# Patient Record
Sex: Male | Born: 1970 | Hispanic: Yes | Marital: Single | Smoking: Never smoker
Health system: Southern US, Community
[De-identification: ages and names within clinical notes are randomized; demographics above are authoritative.]

## PROBLEM LIST (undated history)

## (undated) DIAGNOSIS — R7303 Prediabetes: Secondary | ICD-10-CM

---

## 2015-09-30 ENCOUNTER — Encounter (HOSPITAL_COMMUNITY): Payer: Self-pay | Admitting: *Deleted

## 2015-09-30 ENCOUNTER — Emergency Department (HOSPITAL_COMMUNITY)
Admission: EM | Admit: 2015-09-30 | Discharge: 2015-09-30 | Disposition: A | Discharge status: Home / Self Care | Payer: Self-pay | Attending: Emergency Medicine | Admitting: Emergency Medicine

## 2015-09-30 DIAGNOSIS — J029 Acute pharyngitis, unspecified: Secondary | ICD-10-CM | POA: Insufficient documentation

## 2015-09-30 MED ORDER — HYDROCODONE-ACETAMINOPHEN 5-325 MG PO TABS: 2.0000 | ORAL_TABLET | Freq: Once | ORAL | Status: DC

## 2015-09-30 NOTE — ED Notes (Signed)
Pt in c/o sore throat for the last month, states he has been seen in the past for same but symptoms are not improving, pt reports bleeding in the back of his throat at times, denies fever

## 2015-09-30 NOTE — ED Provider Notes (Signed)
CSN: [id number]     Arrival date & time 09/30/15  1919 History  By signing my name below, I, Placido Sou, attest that this documentation has been prepared under the direction and in the presence of Melton Krebs PA-C. Electronically Signed: Placido Sou, ED Scribe. 09/30/2015. 7:42 PM.    Chief Complaint  Patient presents with  . Sore Throat   The history is provided by the patient. No language interpreter was used.   HPI Comments: Vincent Sawyer is a 45 y.o. male who presents to the Emergency Department complaining of worsening, moderate, bilateral sore throat onset 1 month ago. Pt was seen 1 month ago for similar symptoms and prescribed diclofenac which he says has provided no relief further noting associated, intermittent, hemoptysis that began after singing and voice change. He denies a hx of smoking or a SHx to his neck. He denies any recent travel. Pt reports a PMHx including pre-diabetes. Pt denies fevers, chills, cough or any other associated symptoms at this time.   No past medical history on file. No past surgical history on file. No family history on file. Social History  Substance Use Topics  . Smoking status: Not on file  . Smokeless tobacco: Not on file  . Alcohol Use: Not on file    Review of Systems A complete 10 system review of systems was obtained and all systems are negative except as noted in the HPI and PMH.   Allergies  Review of patient's allergies indicates not on file.  Home Medications   Prior to Admission medications   Not on File   BP 115/72 mmHg  Pulse 61  Temp(Src) 97.8 F (36.6 C) (Oral)  Resp 16  SpO2 100%  Physical Exam  Constitutional: He is oriented to person, place, and time. He appears well-developed and well-nourished.  HENT:  Head: Normocephalic and atraumatic.  Mouth/Throat: Uvula is midline.  Tonsils 1+ bilaterally without erythema. Airway intact. No uvula deviation. No masses visualized  Eyes: EOM are  normal.  Neck: Normal range of motion.  Cardiovascular: Normal rate.   Pulmonary/Chest: Effort normal. No respiratory distress.  Abdominal: Soft.  Musculoskeletal: Normal range of motion.  Neurological: He is alert and oriented to person, place, and time.  Skin: Skin is warm and dry.  Psychiatric: He has a normal mood and affect.  Nursing note and vitals reviewed.  ED Course  Procedures  DIAGNOSTIC STUDIES: Oxygen Saturation is 100% on RA, normal by my interpretation.    COORDINATION OF CARE: 7:39 PM Discussed next steps with pt. He verbalized understanding and is agreeable with the plan.   MDM   Final diagnoses:  Pharyngitis   Patient non-toxic appearing and VSS. Normal ENT exam. I will have patient follow-up with ENT for his ongoing hoarseness and "bleeding throat." No concern for Ludwigs or PTA.  Patient may be safely discharged home. Discussed reasons for return. Patient to follow-up with ENT. Patient in understanding and agreement with the plan.  I personally performed the services described in this documentation, which was scribed in my presence. The recorded information has been reviewed and is accurate.    Melton Krebs, PA-C 10/01/15 0041  Benjiman Core, MD 10/01/15 1550

## 2015-09-30 NOTE — ED Notes (Signed)
See PA assessment 

## 2015-09-30 NOTE — Discharge Instructions (Signed)
Faringitis  (Pharyngitis)  La faringitis ocurre cuando la faringe presenta enrojecimiento, dolor e hinchazón (inflamación).   CAUSAS   Normalmente, la faringitis se debe a una infección. Generalmente, estas infecciones ocurren debido a virus (viral) y se presentan cuando las personas se resfrían. Sin embargo, a veces la faringitis es provocada por bacterias (bacteriana). Las alergias también pueden ser una causa de la faringitis. La faringitis viral se puede contagiar de una persona a otra al toser, estornudar y compartir objetos o utensilios personales (tazas, tenedores, cucharas, cepillos de diente). La faringitis bacteriana se puede contagiar de una persona a otra a través de un contacto más íntimo, como besar.   SIGNOS Y SÍNTOMAS   Los síntomas de la faringitis incluyen los siguientes:   · Dolor de garganta.  · Cansancio (fatiga).  · Fiebre no muy elevada.  · Dolor de cabeza.  · Dolores musculares y en las articulaciones.  · Erupciones cutáneas  · Ganglios linfáticos hinchados.  · Una película parecida a las placas en la garganta o las amígdalas (frecuente con la faringitis bacteriana).  DIAGNÓSTICO   El médico le hará preguntas sobre la enfermedad y sus síntomas. Normalmente, todo lo que se necesita para diagnosticar una faringitis son sus antecedentes médicos y un examen físico. A veces se realiza una prueba rápida para estreptococos. También es posible que se realicen otros análisis de laboratorio, según la posible causa.   TRATAMIENTO   La faringitis viral normalmente mejorará en un plazo de 3 a 4 días sin medicamentos. La faringitis bacteriana se trata con medicamentos que matan los gérmenes (antibióticos).   INSTRUCCIONES PARA EL CUIDADO EN EL HOGAR   · Beba gran cantidad de líquido para mantener la orina de tono claro o color amarillo pálido.  · Tome solo medicamentos de venta libre o recetados, según las indicaciones del médico.    Si le receta antibióticos, asegúrese de terminarlos, incluso si comienza  a sentirse mejor.    No tome aspirina.  · Descanse lo suficiente.  · Hágase gárgaras con 8 onzas (227 ml) de agua con sal (½ cucharadita de sal por litro de agua) cada 1 o 2 horas para calmar la garganta.  · Puede usar pastillas (si no corre riesgo de ahogarse) o aerosoles para calmar la garganta.  SOLICITE ATENCIÓN MÉDICA SI:   · Tiene bultos grandes y dolorosos en el cuello.  · Tiene una erupción cutánea.  · Cuando tose elimina una expectoración verde, amarillo amarronado o con sangre.  SOLICITE ATENCIÓN MÉDICA DE INMEDIATO SI:   · El cuello se pone rígido.  · Comienza a babear o no puede tragar líquidos.  · Vomita o no puede retener los medicamentos ni los líquidos.  · Siente un dolor intenso que no se alivia con los medicamentos recomendados.  · Tiene dificultades para respirar (y no debido a la nariz tapada).  ASEGÚRESE DE QUE:   · Comprende estas instrucciones.  · Controlará su afección.  · Recibirá ayuda de inmediato si no mejora o si empeora.     Esta información no tiene como fin reemplazar el consejo del médico. Asegúrese de hacerle al médico cualquier pregunta que tenga.     Document Released: 05/16/2005 Document Revised: 05/27/2013  Elsevier Interactive Patient Education ©2016 Elsevier Inc.

## 2015-12-10 ENCOUNTER — Emergency Department (HOSPITAL_COMMUNITY)
Admission: EM | Admit: 2015-12-10 | Discharge: 2015-12-11 | Disposition: A | Discharge status: Home / Self Care | Payer: Self-pay | Attending: Emergency Medicine | Admitting: Emergency Medicine

## 2015-12-10 ENCOUNTER — Encounter (HOSPITAL_COMMUNITY): Payer: Self-pay | Admitting: Emergency Medicine

## 2015-12-10 DIAGNOSIS — L509 Urticaria, unspecified: Secondary | ICD-10-CM | POA: Insufficient documentation

## 2015-12-10 HISTORY — DX: Prediabetes: R73.03

## 2015-12-10 NOTE — ED Notes (Signed)
Pt. presents with generalized itchy skin rashes/hives onset 3 days ago unrelieved by OTC antihistamine , respirations unlabored , no oral swelling or stridor . Denies fever or chills.

## 2015-12-11 MED ORDER — FAMOTIDINE 20 MG PO TABS: 20.0000 mg | ORAL_TABLET | Freq: Two times a day (BID) | ORAL | Status: DC

## 2015-12-11 MED ORDER — PREDNISONE 20 MG PO TABS: 40.0000 mg | ORAL_TABLET | Freq: Every day | ORAL | Status: DC

## 2015-12-11 MED ORDER — FAMOTIDINE IN NACL 20-0.9 MG/50ML-% IV SOLN
20.0000 mg | Freq: Once | INTRAVENOUS | Status: AC
  Administered 2015-12-11: 20 mg via INTRAVENOUS
  Filled 2015-12-11: qty 50

## 2015-12-11 MED ORDER — DEXAMETHASONE SODIUM PHOSPHATE 10 MG/ML IJ SOLN
10.0000 mg | Freq: Once | INTRAMUSCULAR | Status: AC
  Administered 2015-12-11: 10 mg via INTRAVENOUS
  Filled 2015-12-11: qty 1

## 2015-12-11 NOTE — ED Provider Notes (Signed)
CSN: [id number]     Arrival date & time 12/10/15  2120 History   First MD Initiated Contact with Patient 12/11/15 0010     Chief Complaint  Patient presents with  . Urticaria     (Consider location/radiation/quality/duration/timing/severity/associated sxs/prior Treatment) HPI Vincent Sawyer is a 45 y.o. male who comes in for evaluation of hives. Patient reports symptoms started 3 days ago without any identifiable event. He denies any new lotions, soaps, detergents or other environmental exposures. He reports diffuse hives all over his face, trunk, back and extremities. He denies any difficulties breathing, swallowing, no nausea or vomiting or abdominal cramping. No known allergies. Denies any other medical problems. Has not tried anything to improve his symptoms. Nothing seems to make the problem better or worse.  Past Medical History  Diagnosis Date  . Prediabetes    History reviewed. No pertinent past surgical history. No family history on file. Social History  Substance Use Topics  . Smoking status: Never Smoker   . Smokeless tobacco: None  . Alcohol Use: No    Review of Systems A 10 point review of systems was completed and was negative except for pertinent positives and negatives as mentioned in the history of present illness     Allergies  Review of patient's allergies indicates no known allergies.  Home Medications   Prior to Admission medications   Medication Sig Start Date End Date Taking? Authorizing Provider  famotidine (PEPCID) 20 MG tablet Take 1 tablet (20 mg total) by mouth 2 (two) times daily. 12/11/15   Joycie Peek, PA-C  predniSONE (DELTASONE) 20 MG tablet Take 2 tablets (40 mg total) by mouth daily. 12/11/15   Joycie Peek, PA-C   BP 126/74 mmHg  Pulse 56  Temp(Src) 98 F (36.7 C) (Oral)  Resp 18  Ht  (1.702 m)  Wt 79.833 kg  BMI 27.56 kg/m2  SpO2 100% Physical Exam  Constitutional: He is oriented to person, place, and time. He  appears well-developed and well-nourished. No distress.  HENT:  Head: Normocephalic and atraumatic.  Mouth/Throat: Oropharynx is clear and moist. No oropharyngeal exudate.  Eyes: Conjunctivae are normal. Pupils are equal, round, and reactive to light. Right eye exhibits no discharge. Left eye exhibits no discharge. No scleral icterus.  Neck: Normal range of motion. Neck supple.  Cardiovascular: Normal rate, regular rhythm and normal heart sounds.   Pulmonary/Chest: Effort normal and breath sounds normal. No respiratory distress. He has no wheezes. He has no rales.  Abdominal: Soft. He exhibits no distension and no mass. There is no tenderness. There is no rebound and no guarding.  Musculoskeletal: He exhibits no tenderness.  Neurological: He is alert and oriented to person, place, and time.  Cranial Nerves II-XII grossly intact  Skin: Skin is warm and dry. No rash noted. He is not diaphoretic.  Diffuse urticarial rash throughout head, trunk, chest, back and extremities.  Psychiatric: He has a normal mood and affect.  Nursing note and vitals reviewed.   ED Course  Procedures (including critical care time) Labs Review Labs Reviewed - No data to display  Imaging Review No results found. I have personally reviewed and evaluated these images and lab results as part of my medical decision-making.   EKG Interpretation None     Meds given in ED:  Medications  dexamethasone (DECADRON) injection 10 mg (not administered)  famotidine (PEPCID) IVPB 20 mg premix (not administered)    New Prescriptions   FAMOTIDINE (PEPCID) 20 MG TABLET  Take 1 tablet (20 mg total) by mouth 2 (two) times daily.   PREDNISONE (DELTASONE) 20 MG TABLET    Take 2 tablets (40 mg total) by mouth daily.   Filed Vitals:   12/10/15 2126 12/11/15 0015  BP: 123/94 126/74  Pulse: 72 56  Temp: 98 F (36.7 C)   TempSrc: Oral   Resp: 18   Height: 5\' 7"  (1.702 m)   Weight: 79.833 kg   SpO2: 99% 100%    MDM   Vincent Sawyer is a 45 y.o. male presents with diffuse urticarial rash. No inciting event or environmental exposures. No evidence of anaphylaxis. Widely patent airway. No known allergies. Treated with Decadron, Pepcid in the emergency Department. We will discharge with similar. No evidence of other acute or emergent pathology at this time. Patient overall appears well, nontoxic, hemodynamically stable and appropriate for discharge. Prior to patient discharge, [facility] and [facility]   Final diagnoses:  Urticaria       Joycie PeekBenjamin Donae Kueker, PA-C 12/11/15 0109  Dione Boozeavid Glick, MD 12/11/15 [phone]

## 2015-12-11 NOTE — Discharge Instructions (Signed)
Take your medication as prescribed. He may also continue taking Benadryl at home every 6 hours for itching. Return to ED for new or worsening symptoms as we discussed. Follow up with your doctor in 3 days for reevaluation.  Ronchas  (Hives)  Las ronchas son reas de la piel inflamadas (hinchadas) rojas y que pican. Pueden cambiar de tamao y de ubicacin en el cuerpo. Las Armed forces operational officerronchas pueden aparecer y Geneticist, moleculardesaparecer durante algunas horas o das (ronchas agudas) o durante algunas semanas (ronchas crnicas). No pueden transmitirse de [place] persona a Theodoro Clockotra (no son contagiosas). Pueden empeorar al rascarse, hacer ejercicios y por estrs emocional.  CAUSAS   Reaccin alrgica a alimentos, aditivos o frmacos.  Infecciones, incluso el resfro comn.  Enfermedades, como la vasculitis, el lupus o la enfermedad tiroidea.  Exposicin al sol, al calor o al fro.  La prctica de ejercicios.  El estrs.  El contacto con algunas sustancias qumicas. SNTOMAS   Zonas hinchadas, rojas o blancas, sobre la piel. Las ronchas pueden cambiar de [place], forma, Chinaubicacin y Armed forces logistics/support/administrative officerpueden desaparecer repentinamente.  Picazn.  Hinchazn de las The Northwestern Mutualmanos los pies y [place]el rostro. Esto puede ocurrir si las ronchas se desarrollan en capas profundas de la piel. DIAGNSTICO  El mdico puede diagnosticar el problema haciendo un examen fsico. Conley RollsLe indicar anlisis de sangre o un estudio de la piel para Production assistant, radiodeterminar la causa. En algunos casos, no puede determinarse la causa.  TRATAMIENTO  Los casos leves generalmente mejoran con medicamentos como los antihistamnicos. Los casos ms graves pueden requerir una inyeccin de epinefrina de Associate Professoremergencia. Si se conoce la causa de la urticaria, el tratamiento incluye evitar el factor desencadenante.  INSTRUCCIONES PARA EL CUIDADO EN EL HOGAR   Evite las causas que han desencadenado las ronchas.  Tome los antihistamnicos segn las indicaciones del mdico para reducir la gravedad de las ronchas.  Generalmente se recomiendan los Pathmark Storesantihistamnicos que no son sedantes o con bajo efecto sedante. No conduzca vehculos mientras toma antihistamnicos.  Tome los medicamentos para la picazn exactamente como le indic el mdico.  Use ropas sueltas.  Cumpla con todas las visitas de control, segn le indique su mdico. SOLICITE ATENCIN MDICA SI:   Siente una picazn intensa o persistente que no se calma con los medicamentos.  Le duelen las articulaciones o estn inflamadas. SOLICITE ATENCIN MDICA DE INMEDIATO SI:   Tiene fiebre.  Tiene la boca o los labios hinchados.  Tiene problemas para respirar o tragar.  Siente una opresin en la garganta o en el pecho.  Siente dolor abdominal. Estos problemas pueden ser los primeros signos de una reaccin alrgica que ponga en peligro la vida. Llame a los servicios de emergencia locales (911 en los [place]). ASEGRESE DE QUE:   Comprende estas instrucciones.  Controlar su enfermedad.  Solicitar ayuda de inmediato si no mejora o si empeora.   Esta informacin no tiene Theme park managercomo fin reemplazar el consejo del mdico. Asegrese de hacerle al mdico cualquier pregunta que tenga.   Document Released: 08/06/2005 Document Revised: 08/11/2013 Elsevier Interactive Patient Education [employer]2016 Elsevier Inc.

## 2020-09-11 ENCOUNTER — Other Ambulatory Visit: Payer: Self-pay

## 2020-09-11 ENCOUNTER — Emergency Department (HOSPITAL_COMMUNITY)
Admission: EM | Admit: 2020-09-11 | Discharge: 2020-09-11 | Disposition: A | Discharge status: Home / Self Care | Payer: HRSA Program | Attending: Emergency Medicine | Admitting: Emergency Medicine

## 2020-09-11 ENCOUNTER — Emergency Department (HOSPITAL_COMMUNITY): Payer: HRSA Program

## 2020-09-11 DIAGNOSIS — U071 COVID-19: Secondary | ICD-10-CM | POA: Diagnosis not present

## 2020-09-11 DIAGNOSIS — R0602 Shortness of breath: Secondary | ICD-10-CM

## 2020-09-11 DIAGNOSIS — R519 Headache, unspecified: Secondary | ICD-10-CM | POA: Diagnosis present

## 2020-09-11 LAB — CBC WITH DIFFERENTIAL/PLATELET
Abs Immature Granulocytes: 0.01 10*3/uL (ref 0.00–0.07)
Basophils Absolute: 0 10*3/uL (ref 0.0–0.1)
Basophils Relative: 0 %
Eosinophils Absolute: 0.1 10*3/uL (ref 0.0–0.5)
Eosinophils Relative: 2 %
HCT: 45.3 % (ref 39.0–52.0)
Hemoglobin: 15.3 g/dL (ref 13.0–17.0)
Immature Granulocytes: 0 %
Lymphocytes Relative: 28 %
Lymphs Abs: 1.3 10*3/uL (ref 0.7–4.0)
MCH: 28.7 pg (ref 26.0–34.0)
MCHC: 33.8 g/dL (ref 30.0–36.0)
MCV: 84.8 fL (ref 80.0–100.0)
Monocytes Absolute: 0.5 10*3/uL (ref 0.1–1.0)
Monocytes Relative: 10 %
Neutro Abs: 2.9 10*3/uL (ref 1.7–7.7)
Neutrophils Relative %: 60 %
Platelets: 122 10*3/uL — ABNORMAL LOW (ref 150–400)
RBC: 5.34 MIL/uL (ref 4.22–5.81)
RDW: 13.1 % (ref 11.5–15.5)
WBC: 4.7 10*3/uL (ref 4.0–10.5)
nRBC: 0 % (ref 0.0–0.2)

## 2020-09-11 LAB — BASIC METABOLIC PANEL
Anion gap: 10 (ref 5–15)
BUN: 10 mg/dL (ref 6–20)
CO2: 26 mmol/L (ref 22–32)
Calcium: 8.3 mg/dL — ABNORMAL LOW (ref 8.9–10.3)
Chloride: 103 mmol/L (ref 98–111)
Creatinine, Ser: 0.78 mg/dL (ref 0.61–1.24)
GFR, Estimated: >60 mL/min (ref >60)
Glucose, Bld: 106 mg/dL — ABNORMAL HIGH (ref 70–99)
Potassium: 3.3 mmol/L — ABNORMAL LOW (ref 3.5–5.1)
Sodium: 139 mmol/L (ref 135–145)

## 2020-09-11 LAB — TROPONIN I (HIGH SENSITIVITY)
Troponin I (High Sensitivity): <2 ng/L (ref <18)
Troponin I (High Sensitivity): <2 ng/L (ref <18)

## 2020-09-11 LAB — SARS CORONAVIRUS 2 BY RT PCR (HOSPITAL ORDER, PERFORMED IN ~~LOC~~ HOSPITAL LAB): SARS Coronavirus 2: POSITIVE — AB

## 2020-09-11 MED ORDER — ACETAMINOPHEN 500 MG PO TABS
1000.0000 mg | ORAL_TABLET | Freq: Once | ORAL | Status: AC
Start: 2020-09-11 — End: 2020-09-11
  Administered 2020-09-11: 1000 mg via ORAL
  Filled 2020-09-11: qty 2

## 2020-09-11 MED ORDER — ALBUTEROL SULFATE HFA 108 (90 BASE) MCG/ACT IN AERS
2.0000 | INHALATION_SPRAY | RESPIRATORY_TRACT | Status: DC | PRN
  Administered 2020-09-11: 2 via RESPIRATORY_TRACT
  Filled 2020-09-11: qty 6.7

## 2020-09-11 MED ORDER — ONDANSETRON 4 MG PO TBDP
4.0000 mg | ORAL_TABLET | Freq: Once | ORAL | Status: AC
  Administered 2020-09-11: 4 mg via ORAL
  Filled 2020-09-11: qty 1

## 2020-09-11 MED ORDER — BENZONATATE 100 MG PO CAPS: 100.0000 mg | ORAL_CAPSULE | Freq: Three times a day (TID) | ORAL | 0 refills | Status: DC

## 2020-09-11 MED ORDER — ONDANSETRON 4 MG PO TBDP: 4.0000 mg | ORAL_TABLET | Freq: Three times a day (TID) | ORAL | 0 refills | Status: DC | PRN

## 2020-09-11 NOTE — ED Triage Notes (Addendum)
Pt said he has been having SOB and chest pain with headaches, and not feeling himself for about 2 weeks. Pt said no appetite, weakness. Pt does have low grade fever

## 2020-09-11 NOTE — Discharge Instructions (Signed)
Take the prescribed medication as directed.   Make sure to rest and drink fluids.  Can take tylenol or motrin for headache/bodyaches and/or fever. Quarantine at home, wear a mask if you have to go out. Follow-up with your primary care doctor. Return to the ED for new or worsening symptoms.

## 2020-09-11 NOTE — ED Provider Notes (Signed)
[facility] EMERGENCY DEPARTMENT Provider Note   CSN: [id number] Arrival date & time: 09/11/20  0021     History Chief Complaint  Patient presents with  . Shortness of Breath  . Headache    Vincent Sawyer Vincent Sawyer is a 50 y.o. male.  The history is provided by the patient and medical records.  Shortness of Breath Associated symptoms: cough, fever and headaches   Headache Associated symptoms: cough, fatigue, fever and myalgias     50 year old male with history of prediabetes, presenting to the ED after feeling unwell for the past 2 weeks.  States initially he was having headaches, body aches, poor appetite, and generalized weakness.  Over the past few days he has developed some shortness of breath and cough.  Cough has been productive with some green/yellow sputum.  He denies any hemoptysis.  Has had some low-grade fevers.  States his wife and daughter are also sick with similar but they have not been tested for COVID.  He has had some other sick contacts at work as well.  He is unvaccinated for COVID-19.  Past Medical History:  Diagnosis Date  . Prediabetes     There are no problems to display for this patient.   No past surgical history on file.     No family history on file.  Social History   Tobacco Use  . Smoking status: Never Smoker  Substance Use Topics  . Alcohol use: No  . Drug use: No    Home Medications Prior to Admission medications   Medication Sig Start Date End Date Taking? Authorizing Provider  famotidine (PEPCID) 20 MG tablet Take 1 tablet (20 mg total) by mouth 2 (two) times daily. 12/11/15   Cartner, Sharlet Salina, PA-C  predniSONE (DELTASONE) 20 MG tablet Take 2 tablets (40 mg total) by mouth daily. 12/11/15   Joycie Peek, PA-C    Allergies    Patient has no known allergies.  Review of Systems   Review of Systems  Constitutional: Positive for appetite change, fatigue and fever.  Respiratory: Positive for cough and  shortness of breath.   Musculoskeletal: Positive for myalgias.  Neurological: Positive for headaches.  All other systems reviewed and are negative.   Physical Exam Updated Vital Signs BP 103/73   Pulse 76   Temp 99 F (37.2 C) (Oral)   Resp 18   SpO2 97%   Physical Exam Vitals and nursing note reviewed.  Constitutional:      Appearance: He is well-developed and well-nourished.  HENT:     Head: Normocephalic and atraumatic.     Mouth/Throat:     Mouth: Oropharynx is clear and moist.  Eyes:     Extraocular Movements: EOM normal.     Conjunctiva/sclera: Conjunctivae normal.     Pupils: Pupils are equal, round, and reactive to light.  Cardiovascular:     Rate and Rhythm: Normal rate and regular rhythm.     Heart sounds: Normal heart sounds.  Pulmonary:     Effort: Pulmonary effort is normal.     Breath sounds: Normal breath sounds. No wheezing or rhonchi.     Comments: Lungs are grossly clear, no acute distress, able to speak in sentences without difficulty, O2 sats 97- 98% on room air during exam Abdominal:     General: Bowel sounds are normal.     Palpations: Abdomen is soft.  Musculoskeletal:        General: Normal range of motion.     Cervical back: Normal  range of motion.  Skin:    General: Skin is warm and dry.  Neurological:     Mental Status: He is alert and oriented to person, place, and time.  Psychiatric:        Mood and Affect: Mood and affect normal.     ED Results / Procedures / Treatments   Labs (all labs ordered are listed, but only abnormal results are displayed) Labs Reviewed  SARS CORONAVIRUS 2 BY RT PCR (HOSPITAL ORDER, PERFORMED IN [facility]) - Abnormal; Notable for the following components:      Result Value   SARS Coronavirus 2 POSITIVE (*)    All other components within normal limits  CBC WITH DIFFERENTIAL/PLATELET - Abnormal; Notable for the following components:   Platelets 122 (*)    All other components within normal  limits  BASIC METABOLIC PANEL - Abnormal; Notable for the following components:   Potassium 3.3 (*)    Glucose, Bld 106 (*)    Calcium 8.3 (*)    All other components within normal limits  TROPONIN I (HIGH SENSITIVITY)  TROPONIN I (HIGH SENSITIVITY)    EKG None  Radiology DG Chest Port 1 View  Result Date: 09/11/2020 CLINICAL DATA:  Shortness of breath EXAM: PORTABLE CHEST 1 VIEW COMPARISON:  None. FINDINGS: The heart size and mediastinal contours are within normal limits. Both lungs are clear. The visualized skeletal structures are unremarkable. IMPRESSION: No active disease. Electronically Signed   By: Burman Nieves M.D.   On: 09/11/2020 01:37    Procedures Procedures (including critical care time)  Medications Ordered in ED Medications  albuterol (VENTOLIN HFA) 108 (90 Base) MCG/ACT inhaler 2 puff (2 puffs Inhalation Given 09/11/20 0547)  ondansetron (ZOFRAN-ODT) disintegrating tablet 4 mg (4 mg Oral Given 09/11/20 0547)  acetaminophen (TYLENOL) tablet 1,000 mg (1,000 mg Oral Given 09/11/20 0547)    ED Course  I have reviewed the triage vital signs and the nursing notes.  Pertinent labs & imaging results that were available during my care of the patient were reviewed by me and considered in my medical decision making (see chart for details).    MDM Rules/Calculators/A&P  50 year old male here with 2 weeks of feeling poorly, now having shortness of breath and cough.  Has had sick contacts and reports wife and daughter are sick with similar.  Low-grade fever here but nontoxic in appearance.  He is in no acute respiratory distress and his lungs are grossly clear on exam.  Even with fluid conversation he is maintaining good oxygen saturations in the upper 90s.  Labs are reassuring.  Chest x-ray is clear.  COVID screen is positive.  He is not requiring supplemental oxygen and remains without any signs of distress, will not require hospitalization.  He has had symptoms for greater  than 2 weeks so will not qualify for MAB infusion.  Discussed symptomatic control at home, pushing oral fluids, quarantine precautions.  Rx zofran, tessalon, and inhaler given here.  Follow-up with PCP.  Work note provided along with copy of positive test result.  Return here for new concerns.  Final Clinical Impression(s) / ED Diagnoses Final diagnoses:  COVID-19    Rx / DC Orders ED Discharge Orders    None       Garlon Hatchet, PA-C 09/11/20 0602    Mesner, Barbara Cower, MD 09/11/20 [phone]

## 2020-09-11 NOTE — ED Notes (Signed)
Patient moved to appropriate waiting area 

## 2021-03-20 ENCOUNTER — Emergency Department (HOSPITAL_COMMUNITY): Payer: Self-pay

## 2021-03-20 ENCOUNTER — Observation Stay (HOSPITAL_COMMUNITY)
Admission: EM | Admit: 2021-03-20 | Discharge: 2021-03-22 | Disposition: A | Discharge status: Home / Self Care | Payer: Self-pay | Attending: General Surgery | Admitting: General Surgery

## 2021-03-20 DIAGNOSIS — W231XXA Caught, crushed, jammed, or pinched between stationary objects, initial encounter: Secondary | ICD-10-CM | POA: Insufficient documentation

## 2021-03-20 DIAGNOSIS — Z20822 Contact with and (suspected) exposure to covid-19: Secondary | ICD-10-CM | POA: Insufficient documentation

## 2021-03-20 DIAGNOSIS — S6990XA Unspecified injury of unspecified wrist, hand and finger(s), initial encounter: Secondary | ICD-10-CM | POA: Diagnosis present

## 2021-03-20 DIAGNOSIS — M659 Synovitis and tenosynovitis, unspecified: Secondary | ICD-10-CM

## 2021-03-20 DIAGNOSIS — M65841 Other synovitis and tenosynovitis, right hand: Secondary | ICD-10-CM | POA: Insufficient documentation

## 2021-03-20 DIAGNOSIS — R7303 Prediabetes: Secondary | ICD-10-CM | POA: Insufficient documentation

## 2021-03-20 DIAGNOSIS — S6991XA Unspecified injury of right wrist, hand and finger(s), initial encounter: Principal | ICD-10-CM | POA: Diagnosis present

## 2021-03-20 DIAGNOSIS — Z23 Encounter for immunization: Secondary | ICD-10-CM | POA: Insufficient documentation

## 2021-03-20 LAB — CBC WITH DIFFERENTIAL/PLATELET
Abs Immature Granulocytes: 0.03 10*3/uL (ref 0.00–0.07)
Basophils Absolute: 0 10*3/uL (ref 0.0–0.1)
Basophils Relative: 0 %
Eosinophils Absolute: 0.3 10*3/uL (ref 0.0–0.5)
Eosinophils Relative: 3 %
HCT: 44.2 % (ref 39.0–52.0)
Hemoglobin: 14.3 g/dL (ref 13.0–17.0)
Immature Granulocytes: 0 %
Lymphocytes Relative: 18 %
Lymphs Abs: 1.9 10*3/uL (ref 0.7–4.0)
MCH: 28.4 pg (ref 26.0–34.0)
MCHC: 32.4 g/dL (ref 30.0–36.0)
MCV: 87.7 fL (ref 80.0–100.0)
Monocytes Absolute: 1.1 10*3/uL — ABNORMAL HIGH (ref 0.1–1.0)
Monocytes Relative: 11 %
Neutro Abs: 7.2 10*3/uL (ref 1.7–7.7)
Neutrophils Relative %: 68 %
Platelets: 139 10*3/uL — ABNORMAL LOW (ref 150–400)
RBC: 5.04 MIL/uL (ref 4.22–5.81)
RDW: 13.8 % (ref 11.5–15.5)
WBC: 10.6 10*3/uL — ABNORMAL HIGH (ref 4.0–10.5)
nRBC: 0 % (ref 0.0–0.2)

## 2021-03-20 LAB — BASIC METABOLIC PANEL
Anion gap: 7 (ref 5–15)
BUN: 16 mg/dL (ref 6–20)
CO2: 25 mmol/L (ref 22–32)
Calcium: 8.7 mg/dL — ABNORMAL LOW (ref 8.9–10.3)
Chloride: 105 mmol/L (ref 98–111)
Creatinine, Ser: 0.75 mg/dL (ref 0.61–1.24)
GFR, Estimated: >60 mL/min (ref >60)
Glucose, Bld: 84 mg/dL (ref 70–99)
Potassium: 4.1 mmol/L (ref 3.5–5.1)
Sodium: 137 mmol/L (ref 135–145)

## 2021-03-20 MED ORDER — TETANUS-DIPHTH-ACELL PERTUSSIS 5-2.5-18.5 LF-MCG/0.5 IM SUSY
0.5000 mL | PREFILLED_SYRINGE | Freq: Once | INTRAMUSCULAR | Status: AC
  Administered 2021-03-21: 0.5 mL via INTRAMUSCULAR
  Filled 2021-03-20: qty 0.5

## 2021-03-20 MED ORDER — IBUPROFEN 200 MG PO TABS
600.0000 mg | ORAL_TABLET | Freq: Once | ORAL | Status: AC
Start: 2021-03-20 — End: 2021-03-20
  Administered 2021-03-20: 600 mg via ORAL
  Filled 2021-03-20: qty 1

## 2021-03-20 NOTE — ED Provider Notes (Signed)
Emergency Medicine Provider Triage Evaluation Note  Vincent Sawyer , a 51 y.o. male  was evaluated in triage.  Pt complains of wound to the right middle finger sustained yesterday when he pinched it in a paint sprayer.  He states that a lot of the pain sprayed into his finger and when he applied pressure afterwards repeat within the wound.  Now the finger is red, swollen, tender to palpation with pain streaking up the hand without fever or chills.  Unknown last tetanus.  Review of Systems  Positive: Wound, redness, pain in the right middle finger Negative: Fevers, chills, nausea, vomit  Physical Exam  BP 115/82 (BP Location: Left Arm)   Pulse (!) 56   Temp 99.1 F (37.3 C)   Resp 18   SpO2 99%  Gen:   Awake, no distress   Resp:  Normal effort  MSK:   Moves extremities without difficulty  Other:  Laceration to the dorsum of the distal right middle finger with paint over the skin.  Erythematous, edematous right middle finger with redness streaking proximally up the finger without extension into the palm.  Decreased range of motion due to edema.  Tenderness to palpation.  2+ radial pulses and normal cap refill in all 5 digits of the right hand.  Medical Decision Making  Medically screening exam initiated at 9:23 PM.  Appropriate orders placed.  Vincent Sawyer was informed that the remainder of the evaluation will be completed by another provider, this initial triage assessment does not replace that evaluation, and the importance of remaining in the ED until their evaluation is complete.  This chart was dictated using voice recognition software, Dragon. Despite the best efforts of this provider to proofread and correct errors, errors may still occur which can change documentation meaning.    Sherrilee Gilles 03/20/21 2128    Melene Plan, DO 03/20/21 2316

## 2021-03-20 NOTE — ED Triage Notes (Signed)
Pt pinched R middle finger in paint sprayer yesterday, endorses pain, swelling, tenderness at sight. Pt states friend told him to get it checked out because it's "serious."

## 2021-03-21 ENCOUNTER — Encounter (HOSPITAL_COMMUNITY): Payer: Self-pay | Admitting: Internal Medicine

## 2021-03-21 ENCOUNTER — Observation Stay (HOSPITAL_COMMUNITY): Payer: Self-pay | Admitting: Anesthesiology

## 2021-03-21 ENCOUNTER — Encounter (HOSPITAL_COMMUNITY)
Admission: EM | Disposition: A | Discharge status: Home / Self Care | Payer: Self-pay | Source: Home / Self Care | Attending: Emergency Medicine

## 2021-03-21 DIAGNOSIS — S6991XA Unspecified injury of right wrist, hand and finger(s), initial encounter: Secondary | ICD-10-CM | POA: Diagnosis present

## 2021-03-21 DIAGNOSIS — S6990XA Unspecified injury of unspecified wrist, hand and finger(s), initial encounter: Secondary | ICD-10-CM | POA: Diagnosis present

## 2021-03-21 HISTORY — PX: I & D EXTREMITY: SHX5045

## 2021-03-21 LAB — HIV ANTIBODY (ROUTINE TESTING W REFLEX): HIV Screen 4th Generation wRfx: NONREACTIVE

## 2021-03-21 LAB — RESP PANEL BY RT-PCR (FLU A&B, COVID) ARPGX2
Influenza A by PCR: NEGATIVE
Influenza B by PCR: NEGATIVE
SARS Coronavirus 2 by RT PCR: NEGATIVE

## 2021-03-21 LAB — GLUCOSE, CAPILLARY: Glucose-Capillary: 75 mg/dL (ref 70–99)

## 2021-03-21 SURGERY — IRRIGATION AND DEBRIDEMENT EXTREMITY
Anesthesia: Monitor Anesthesia Care | Site: Arm Lower | Laterality: Right

## 2021-03-21 MED ORDER — PROPOFOL 10 MG/ML IV BOLUS
INTRAVENOUS | Status: DC | PRN
  Administered 2021-03-21: 100 mg via INTRAVENOUS

## 2021-03-21 MED ORDER — ACETAMINOPHEN 10 MG/ML IV SOLN
INTRAVENOUS | Status: AC
  Filled 2021-03-21: qty 100

## 2021-03-21 MED ORDER — FENTANYL CITRATE (PF) 100 MCG/2ML IJ SOLN
INTRAMUSCULAR | Status: AC
  Filled 2021-03-21: qty 2

## 2021-03-21 MED ORDER — LACTATED RINGERS IV SOLN: INTRAVENOUS | Status: DC

## 2021-03-21 MED ORDER — SODIUM CHLORIDE 0.9% FLUSH
3.0000 mL | Freq: Two times a day (BID) | INTRAVENOUS | Status: DC
  Administered 2021-03-21 (×2): 3 mL via INTRAVENOUS

## 2021-03-21 MED ORDER — CHLORHEXIDINE GLUCONATE 0.12 % MT SOLN
OROMUCOSAL | Status: AC
  Administered 2021-03-21: 15 mL via OROMUCOSAL
  Filled 2021-03-21: qty 15

## 2021-03-21 MED ORDER — OXYCODONE HCL 5 MG PO TABS
5.0000 mg | ORAL_TABLET | Freq: Once | ORAL | Status: DC | PRN
Start: 2021-03-21 — End: 2021-03-21

## 2021-03-21 MED ORDER — OXYCODONE HCL 5 MG/5ML PO SOLN: 5.0000 mg | Freq: Once | ORAL | Status: DC | PRN

## 2021-03-21 MED ORDER — HYDROCODONE-ACETAMINOPHEN 5-325 MG PO TABS
1.0000 | ORAL_TABLET | Freq: Once | ORAL | Status: AC
  Administered 2021-03-21: 1 via ORAL
  Filled 2021-03-21: qty 1

## 2021-03-21 MED ORDER — ACETAMINOPHEN 10 MG/ML IV SOLN
1000.0000 mg | Freq: Once | INTRAVENOUS | Status: DC | PRN
  Administered 2021-03-21: 1000 mg via INTRAVENOUS

## 2021-03-21 MED ORDER — PROPOFOL 500 MG/50ML IV EMUL
INTRAVENOUS | Status: DC | PRN
  Administered 2021-03-21: 150 ug/kg/min via INTRAVENOUS

## 2021-03-21 MED ORDER — CEFAZOLIN SODIUM-DEXTROSE 1-4 GM/50ML-% IV SOLN
1.0000 g | Freq: Once | INTRAVENOUS | Status: AC
  Administered 2021-03-21: 1 g via INTRAVENOUS
  Filled 2021-03-21: qty 50

## 2021-03-21 MED ORDER — ONDANSETRON HCL 4 MG/2ML IJ SOLN
INTRAMUSCULAR | Status: DC | PRN
Start: 2021-03-21 — End: 2021-03-21
  Administered 2021-03-21: 4 mg via INTRAVENOUS

## 2021-03-21 MED ORDER — ACETAMINOPHEN 500 MG PO TABS: 1000.0000 mg | ORAL_TABLET | Freq: Once | ORAL | Status: DC | PRN

## 2021-03-21 MED ORDER — MORPHINE SULFATE (PF) 2 MG/ML IV SOLN
2.0000 mg | INTRAVENOUS | Status: DC | PRN
Start: 2021-03-21 — End: 2021-03-21
  Administered 2021-03-21 (×2): 2 mg via INTRAVENOUS
  Filled 2021-03-21 (×2): qty 1

## 2021-03-21 MED ORDER — CEFAZOLIN SODIUM-DEXTROSE 1-4 GM/50ML-% IV SOLN
1.0000 g | Freq: Three times a day (TID) | INTRAVENOUS | Status: DC
  Administered 2021-03-21 – 2021-03-22 (×4): 1 g via INTRAVENOUS
  Filled 2021-03-21 (×7): qty 50

## 2021-03-21 MED ORDER — BUPIVACAINE HCL (PF) 0.25 % IJ SOLN
INTRAMUSCULAR | Status: AC
  Filled 2021-03-21: qty 30

## 2021-03-21 MED ORDER — MIDAZOLAM HCL 5 MG/5ML IJ SOLN
INTRAMUSCULAR | Status: DC | PRN
  Administered 2021-03-21: 2 mg via INTRAVENOUS

## 2021-03-21 MED ORDER — ORAL CARE MOUTH RINSE: 15.0000 mL | Freq: Once | OROMUCOSAL | Status: AC

## 2021-03-21 MED ORDER — PROPOFOL 1000 MG/100ML IV EMUL
INTRAVENOUS | Status: AC
  Filled 2021-03-21: qty 100

## 2021-03-21 MED ORDER — BUPIVACAINE HCL (PF) 0.25 % IJ SOLN
INTRAMUSCULAR | Status: DC | PRN
  Administered 2021-03-21: 14 mL

## 2021-03-21 MED ORDER — ACETAMINOPHEN 160 MG/5ML PO SOLN: 1000.0000 mg | Freq: Once | ORAL | Status: DC | PRN

## 2021-03-21 MED ORDER — 0.9 % SODIUM CHLORIDE (POUR BTL) OPTIME
TOPICAL | Status: DC | PRN
  Administered 2021-03-21: 1000 mL

## 2021-03-21 MED ORDER — FENTANYL CITRATE (PF) 100 MCG/2ML IJ SOLN
25.0000 ug | INTRAMUSCULAR | Status: DC | PRN
  Administered 2021-03-21: 25 ug via INTRAVENOUS

## 2021-03-21 MED ORDER — ACETAMINOPHEN 650 MG RE SUPP: 650.0000 mg | Freq: Four times a day (QID) | RECTAL | Status: DC | PRN

## 2021-03-21 MED ORDER — MORPHINE SULFATE (PF) 4 MG/ML IV SOLN
4.0000 mg | INTRAVENOUS | Status: DC | PRN
  Administered 2021-03-21 – 2021-03-22 (×5): 4 mg via INTRAVENOUS
  Filled 2021-03-21 (×4): qty 1

## 2021-03-21 MED ORDER — ACETAMINOPHEN 325 MG PO TABS
650.0000 mg | ORAL_TABLET | Freq: Four times a day (QID) | ORAL | Status: DC | PRN
  Administered 2021-03-22: 650 mg via ORAL
  Filled 2021-03-21: qty 2

## 2021-03-21 MED ORDER — CHLORHEXIDINE GLUCONATE 0.12 % MT SOLN: 15.0000 mL | Freq: Once | OROMUCOSAL | Status: AC

## 2021-03-21 MED ORDER — MIDAZOLAM HCL 2 MG/2ML IJ SOLN
INTRAMUSCULAR | Status: AC
  Filled 2021-03-21: qty 2

## 2021-03-21 MED ORDER — CEFAZOLIN SODIUM-DEXTROSE 2-3 GM-%(50ML) IV SOLR
INTRAVENOUS | Status: DC | PRN
  Administered 2021-03-21: 2 g via INTRAVENOUS

## 2021-03-21 MED ORDER — CEFAZOLIN SODIUM 1 G IJ SOLR
INTRAMUSCULAR | Status: AC
  Filled 2021-03-21: qty 20

## 2021-03-21 SURGICAL SUPPLY — 33 items
BNDG CONFORM 3 STRL LF (GAUZE/BANDAGES/DRESSINGS) ×2 IMPLANT
BNDG ELASTIC 2 VLCR STRL LF (GAUZE/BANDAGES/DRESSINGS) ×2 IMPLANT
BNDG ELASTIC 3X5.8 VLCR STR LF (GAUZE/BANDAGES/DRESSINGS) ×2 IMPLANT
BNDG ELASTIC 4X5.8 VLCR STR LF (GAUZE/BANDAGES/DRESSINGS) IMPLANT
BNDG ESMARK 4X9 LF (GAUZE/BANDAGES/DRESSINGS) ×2 IMPLANT
BNDG GAUZE ELAST 4 BULKY (GAUZE/BANDAGES/DRESSINGS) IMPLANT
CORD BIPOLAR FORCEPS 12FT (ELECTRODE) ×2 IMPLANT
DRAPE SURG 17X23 STRL (DRAPES) ×2 IMPLANT
GAUZE PACKING IODOFORM 1/4X15 (PACKING) IMPLANT
GAUZE SPONGE 4X4 12PLY STRL (GAUZE/BANDAGES/DRESSINGS) ×2 IMPLANT
GAUZE XEROFORM 1X8 LF (GAUZE/BANDAGES/DRESSINGS) ×2 IMPLANT
GLOVE SURG ENC TEXT LTX SZ8 (GLOVE) ×2 IMPLANT
GOWN STRL REUS W/ TWL LRG LVL3 (GOWN DISPOSABLE) ×2 IMPLANT
GOWN STRL REUS W/TWL LRG LVL3 (GOWN DISPOSABLE) ×2
IV CATH 22GX1 FEP (IV SOLUTION) ×2 IMPLANT
JET LAVAGE IRRISEPT WOUND (IRRIGATION / IRRIGATOR) ×2
KIT BASIN OR (CUSTOM PROCEDURE TRAY) ×2 IMPLANT
KIT TURNOVER KIT B (KITS) ×2 IMPLANT
LAVAGE JET IRRISEPT WOUND (IRRIGATION / IRRIGATOR) ×1 IMPLANT
MANIFOLD NEPTUNE II (INSTRUMENTS) ×2 IMPLANT
NEEDLE HYPO 25GX1X1/2 BEV (NEEDLE) ×2 IMPLANT
NS IRRIG 1000ML POUR BTL (IV SOLUTION) ×2 IMPLANT
PACK ORTHO EXTREMITY (CUSTOM PROCEDURE TRAY) ×2 IMPLANT
PAD ARMBOARD 7.5X6 YLW CONV (MISCELLANEOUS) ×4 IMPLANT
SPONGE T-LAP 18X18 ~~LOC~~+RFID (SPONGE) IMPLANT
SPONGE T-LAP 4X18 ~~LOC~~+RFID (SPONGE) ×2 IMPLANT
SWAB CULTURE ESWAB REG 1ML (MISCELLANEOUS) IMPLANT
SYR CONTROL 10ML LL (SYRINGE) ×2 IMPLANT
TOWEL GREEN STERILE (TOWEL DISPOSABLE) ×2 IMPLANT
TOWEL GREEN STERILE FF (TOWEL DISPOSABLE) ×2 IMPLANT
TUBE CONNECTING 12X1/4 (SUCTIONS) ×2 IMPLANT
WATER STERILE IRR 1000ML POUR (IV SOLUTION) ×2 IMPLANT
YANKAUER SUCT BULB TIP NO VENT (SUCTIONS) ×2 IMPLANT

## 2021-03-21 NOTE — Anesthesia Preprocedure Evaluation (Signed)
Anesthesia Evaluation  Patient identified by MRN, date of birth, ID band Patient awake    Reviewed: Allergy & Precautions, NPO status , Patient's Chart, lab work & pertinent test results  History of Anesthesia Complications Negative for: history of anesthetic complications  Airway Mallampati: II  TM Distance: >3 FB Neck ROM: Full    Dental  (+) Dental Advisory Given, Teeth Intact   Pulmonary neg pulmonary ROS,    breath sounds clear to auscultation       Cardiovascular negative cardio ROS   Rhythm:Regular     Neuro/Psych negative neurological ROS  negative psych ROS   GI/Hepatic negative GI ROS, Neg liver ROS,   Endo/Other  negative endocrine ROS  Renal/GU negative Renal ROS     Musculoskeletal Right middle finger infection   Abdominal   Peds  Hematology negative hematology ROS (+)   Anesthesia Other Findings   Reproductive/Obstetrics                             Anesthesia Physical Anesthesia Plan  ASA: 1  Anesthesia Plan: MAC   Post-op Pain Management:    Induction: Intravenous  PONV Risk Score and Plan: 1 and Propofol infusion  Airway Management Planned: Nasal Cannula  Additional Equipment: None  Intra-op Plan:   Post-operative Plan:   Informed Consent: I have reviewed the patients History and Physical, chart, labs and discussed the procedure including the risks, benefits and alternatives for the proposed anesthesia with the patient or authorized representative who has indicated his/her understanding and acceptance.     Dental advisory given  Plan Discussed with: CRNA, Anesthesiologist and Surgeon  Anesthesia Plan Comments:         Anesthesia Quick Evaluation

## 2021-03-21 NOTE — ED Notes (Signed)
This RN was called to the bedside by pt. Pt stated his pain is 10/10 and that the pain medicine is not helping at all. Pt asking if he can get something else for pain. This RN informed pt that she would have to page admitting but would let him know if I heard something back. This RN asked the Secretary Lupita Leash to page admitting. Will continue to monitor.

## 2021-03-21 NOTE — Transfer of Care (Signed)
Immediate Anesthesia Transfer of Care Note  Patient: Vincent Sawyer  Procedure(s) Performed: IRRIGATION AND DEBRIDEMENT RIGHT LONG FINGER (Right: Arm Lower)  Patient Location: PACU  Anesthesia Type:General  Level of Consciousness: awake, alert  and oriented  Airway & Oxygen Therapy: Patient Spontanous Breathing  Post-op Assessment: Report given to RN, Post -op Vital signs reviewed and stable and Patient moving all extremities X 4  Post vital signs: Reviewed and stable  Last Vitals:  Vitals Value Taken Time  BP    Temp    Pulse 73 03/21/21 1610  Resp 18 03/21/21 1610  SpO2 100 % 03/21/21 1610  Vitals shown include unvalidated device data.  Last Pain:  Vitals:   03/21/21 1450  TempSrc:   PainSc: 10-Worst pain ever         Complications: No notable events documented.

## 2021-03-21 NOTE — H&P (Signed)
Reason for Consult:finger infection Referring Physician: ER  CC:I had paint in my finger  HPI:  Vincent Sawyer is an 50 y.o. right handed male who presents after high pressure paint injection injury to RLF, occurring 1 day ago .  Pt states water based pain, c/o pain, and inability to fully flex finger. Pain is rated at    8/10 and is described as sharp.  Pain is constant.  Pain is made better by rest/immobilization, worse with motion.   Associated signs/symptoms:none other Previous treatment:  n/a  Past Medical History:  Diagnosis Date   Prediabetes     History reviewed. No pertinent surgical history.  History reviewed. No pertinent family history.  Social History:  reports that he has never smoked. He has never used smokeless tobacco. He reports that he does not drink alcohol and does not use drugs.  Allergies: No Known Allergies  Medications: I have reviewed the patient's current medications.  Results for orders placed or performed during the hospital encounter of 03/20/21 (from the past 48 hour(s))  Basic metabolic panel     Status: Abnormal   Collection Time: 03/20/21  9:34 PM  Result Value Ref Range   Sodium 137 135 - 145 mmol/L   Potassium 4.1 3.5 - 5.1 mmol/L   Chloride 105 98 - 111 mmol/L   CO2 25 22 - 32 mmol/L   Glucose, Bld 84 70 - 99 mg/dL    Comment: Glucose reference range applies only to samples taken after fasting for at least 8 hours.   BUN 16 6 - 20 mg/dL   Creatinine, Ser 1.19 0.61 - 1.24 mg/dL   Calcium 8.7 (L) 8.9 - 10.3 mg/dL   GFR, Estimated >14 >78 mL/min    Comment: (NOTE) Calculated using the CKD-EPI Creatinine Equation (2021)    Anion gap 7 5 - 15    Comment: Performed at [facility], [address], [place], [place] [address]  CBC with Differential     Status: Abnormal   Collection Time: 03/20/21  9:34 PM  Result Value Ref Range   WBC 10.6 (H) 4.0 - 10.5 K/uL   RBC 5.04 4.22 - 5.81 MIL/uL   Hemoglobin 14.3 13.0 - 17.0 g/dL    HCT 13.0 86.5 - 78.4 %   MCV 87.7 80.0 - 100.0 fL   MCH 28.4 26.0 - 34.0 pg   MCHC 32.4 30.0 - 36.0 g/dL   RDW 69.6 29.5 - 28.4 %   Platelets 139 (L) 150 - 400 K/uL   nRBC 0.0 0.0 - 0.2 %   Neutrophils Relative % 68 %   Neutro Abs 7.2 1.7 - 7.7 K/uL   Lymphocytes Relative 18 %   Lymphs Abs 1.9 0.7 - 4.0 K/uL   Monocytes Relative 11 %   Monocytes Absolute 1.1 (H) 0.1 - 1.0 K/uL   Eosinophils Relative 3 %   Eosinophils Absolute 0.3 0.0 - 0.5 K/uL   Basophils Relative 0 %   Basophils Absolute 0.0 0.0 - 0.1 K/uL   Immature Granulocytes 0 %   Abs Immature Granulocytes 0.03 0.00 - 0.07 K/uL    Comment: Performed at [facility], [address], [place], [place] [address]  Resp Panel by RT-PCR (Flu A&B, Covid) Nasopharyngeal Swab     Status: None   Collection Time: 03/21/21  5:08 AM   Specimen: Nasopharyngeal Swab; Nasopharyngeal(NP) swabs in vial transport medium  Result Value Ref Range   SARS Coronavirus 2 by RT PCR NEGATIVE  NEGATIVE    Comment: (NOTE) SARS-CoV-2 target nucleic acids are NOT DETECTED.  The SARS-CoV-2 RNA is generally detectable in upper respiratory specimens during the acute phase of infection. The lowest concentration of SARS-CoV-2 viral copies this assay can detect is 138 copies/mL. A negative result does not preclude SARS-Cov-2 infection and should not be used as the sole basis for treatment or other patient management decisions. A negative result may occur with  improper specimen collection/handling, submission of specimen other than nasopharyngeal swab, presence of viral mutation(s) within the areas targeted by this assay, and inadequate number of viral copies(<138 copies/mL). A negative result must be combined with clinical observations, patient history, and epidemiological information. The expected result is Negative.  Fact Sheet for Patients:  BloggerCourse.com  Fact Sheet for Healthcare Providers:   SeriousBroker.it  This test is no t yet approved or cleared by the [place] FDA and  has been authorized for detection and/or diagnosis of SARS-CoV-2 by FDA under an Emergency Use Authorization (EUA). This EUA will remain  in effect (meaning this test can be used) for the duration of the COVID-19 declaration under Section 564(b)(1) of the Act, 21 U.S.C.section 360bbb-3(b)(1), unless the authorization is terminated  or revoked sooner.       Influenza A by PCR NEGATIVE NEGATIVE   Influenza B by PCR NEGATIVE NEGATIVE    Comment: (NOTE) The Xpert Xpress SARS-CoV-2/FLU/RSV plus assay is intended as an aid in the diagnosis of influenza from Nasopharyngeal swab specimens and should not be used as a sole basis for treatment. Nasal washings and aspirates are unacceptable for Xpert Xpress SARS-CoV-2/FLU/RSV testing.  Fact Sheet for Patients: BloggerCourse.com  Fact Sheet for Healthcare Providers: SeriousBroker.it  This test is not yet approved or cleared by the [place] FDA and has been authorized for detection and/or diagnosis of SARS-CoV-2 by FDA under an Emergency Use Authorization (EUA). This EUA will remain in effect (meaning this test can be used) for the duration of the COVID-19 declaration under Section 564(b)(1) of the Act, 21 U.S.C. section 360bbb-3(b)(1), unless the authorization is terminated or revoked.  Performed at [facility], [address] [place], [place] [address]   HIV Antibody (routine testing w rflx)     Status: None   Collection Time: 03/21/21 11:15 AM  Result Value Ref Range   HIV Screen 4th Generation wRfx Non Reactive Non Reactive    Comment: Performed at [facility], [address] [place], [place] [address]    DG Finger Middle Right  Result Date: 03/20/2021 CLINICAL DATA:  Right long finger pain, wound EXAM: RIGHT MIDDLE FINGER 2+V COMPARISON:  None.  FINDINGS: There is no evidence of fracture or dislocation. There is no evidence of arthropathy or other focal bone abnormality. Faint amorphous radiodensity within the soft tissues of the long finger at the level of the DIP joint and distal phalanx. Mild diffuse soft tissue swelling of the long finger. IMPRESSION: 1. Soft tissue swelling without evidence of acute fracture or dislocation. 2. Faint amorphous radiodensity within the soft tissues of the long finger at the level of the DIP joint and distal phalanx, suggestive of foreign body/paint within the soft tissues. Electronically Signed   By: Duanne Guess D.O.   On: 03/20/2021 22:00    Pertinent items are noted in HPI. Temp:  [98 F (36.7 C)-99.1 F (37.3 C)] 99 F (37.2 C) (08/02 1433) Pulse Rate:  [49-65] 49 (08/02 1433) Resp:  [12-18] 18 (08/02 1433) BP: (96-136)/(58-82) 136/80 (  08/02 1433) SpO2:  [98 %-100 %] 99 % (08/02 1433) General appearance: alert and cooperative Resp: clear to auscultation bilaterally Cardio: regular rate and rhythm GI: soft, non-tender; bowel sounds normal; no masses,  no organomegaly Extremities: RLF with diffuse swelling, injection entrance at dip flexion crease, swelling back to prox phalanx, with some erythema, no palm tendernes   Assessment: Injection injury RLF Plan: Needs exploration, I&D, washout I have discussed this treatment plan in detail with patient andamily, including the risks of the recommended treatment or surgery, the benefits and the alternatives.  The patient and caregiver understands that additional treatment may be necessary.  Keymani Glynn C Antrell Tipler 03/21/2021, 3:18 PM

## 2021-03-21 NOTE — ED Provider Notes (Signed)
[facility] EMERGENCY DEPARTMENT Provider Note   CSN: [id number] Arrival date & time: 03/20/21  1936     History Chief Complaint  Patient presents with   Finger Injury    Vincent Sawyer is a 50 y.o. male.  The history is provided by the patient.  Vincent Sawyer is a 50 y.o. male who presents to the Emergency Department complaining of finger injury. He presents the emergency department complaining of injury to his right middle finger that occurred Sunday night around 6 PM. He states that it was pinched in a paint sprayer at 650 psi. He states that the pain is increasing with increasing swelling to the digit. Pain radiates up his hand and into his forearm. No reports of fevers, nausea, vomiting. He is left-hand dominant. He is prediabetic. Symptoms are moderate to severe, constant, worsening.    Past Medical History:  Diagnosis Date   Prediabetes     There are no problems to display for this patient.   No past surgical history on file.     No family history on file.  Social History   Tobacco Use   Smoking status: Never  Substance Use Topics   Alcohol use: No   Drug use: No    Home Medications Prior to Admission medications   Medication Sig Start Date End Date Taking? Authorizing Provider  benzonatate (TESSALON) 100 MG capsule Take 1 capsule (100 mg total) by mouth every 8 (eight) hours. 09/11/20   Garlon Hatchet, PA-C  famotidine (PEPCID) 20 MG tablet Take 1 tablet (20 mg total) by mouth 2 (two) times daily. 12/11/15   Cartner, Sharlet Salina, PA-C  ondansetron (ZOFRAN ODT) 4 MG disintegrating tablet Take 1 tablet (4 mg total) by mouth every 8 (eight) hours as needed for nausea. 09/11/20   Garlon Hatchet, PA-C  predniSONE (DELTASONE) 20 MG tablet Take 2 tablets (40 mg total) by mouth daily. 12/11/15   Joycie Peek, PA-C    Allergies    Patient has no known allergies.  Review of Systems   Review of Systems  All other systems reviewed  and are negative.  Physical Exam Updated Vital Signs BP (!) 104/59   Pulse (!) 56   Temp 98 F (36.7 C)   Resp 16   SpO2 99%   Physical Exam Constitutional:      Appearance: Normal appearance.  HENT:     Head: Normocephalic and atraumatic.  Cardiovascular:     Rate and Rhythm: Normal rate and regular rhythm.  Pulmonary:     Effort: Pulmonary effort is normal. No respiratory distress.  Musculoskeletal:        General: Swelling and tenderness present.     Comments: 2+ radial pulse. There is a small wound to the pad of the right third digit with surrounding erythema and induration. Erythema extends to the third PIP joint. Patient is unable to flex the third digit. There is tenderness to palpation along the flexor tendon sheath of the entire right third digit.  Skin:    General: Skin is warm and dry.     Capillary Refill: Capillary refill takes less than 2 seconds.  Neurological:     Mental Status: He is alert and oriented to person, place, and time.  Psychiatric:        Mood and Affect: Mood normal.       ED Results / Procedures / Treatments   Labs (all labs ordered are listed, but only abnormal results are  displayed) Labs Reviewed  BASIC METABOLIC PANEL - Abnormal; Notable for the following components:      Result Value   Calcium 8.7 (*)    All other components within normal limits  CBC WITH DIFFERENTIAL/PLATELET - Abnormal; Notable for the following components:   WBC 10.6 (*)    Platelets 139 (*)    Monocytes Absolute 1.1 (*)    All other components within normal limits  RESP PANEL BY RT-PCR (FLU A&B, COVID) [id number]    EKG None  Radiology DG Finger Middle Right  Result Date: 03/20/2021 CLINICAL DATA:  Right long finger pain, wound EXAM: RIGHT MIDDLE FINGER 2+V COMPARISON:  None. FINDINGS: There is no evidence of fracture or dislocation. There is no evidence of arthropathy or other focal bone abnormality. Faint amorphous radiodensity within the soft tissues of the  long finger at the level of the DIP joint and distal phalanx. Mild diffuse soft tissue swelling of the long finger. IMPRESSION: 1. Soft tissue swelling without evidence of acute fracture or dislocation. 2. Faint amorphous radiodensity within the soft tissues of the long finger at the level of the DIP joint and distal phalanx, suggestive of foreign body/paint within the soft tissues. Electronically Signed   By: Duanne Guess D.O.   On: 03/20/2021 22:00    Procedures Procedures   Medications Ordered in ED Medications  ibuprofen (ADVIL) tablet 600 mg (600 mg Oral Given 03/20/21 2132)  Tdap (BOOSTRIX) injection 0.5 mL (0.5 mLs Intramuscular Given 03/21/21 0445)  ceFAZolin (ANCEF) IVPB 1 g/50 mL premix (0 g Intravenous Stopped 03/21/21 0527)  HYDROcodone-acetaminophen (NORCO/VICODIN) 5-325 MG per tablet 1 tablet (1 tablet Oral Given 03/21/21 0537)    ED Course  I have reviewed the triage vital signs and the nursing notes.  Pertinent labs & imaging results that were available during my care of the patient were reviewed by me and considered in my medical decision making (see chart for details).    MDM Rules/Calculators/A&P                          patient here for evaluation of increased pain and redness to the right third digit after a high-pressure paint injection injury that occurred on Sunday night. Examination is concerning for developing flexor tenosynovitis. Discussed with Dr. Izora Ribas with hand surgery. He recommends admission to the medicine service and the patient be kept NPO for surgery later. Medicine consulted for admission.  Final Clinical Impression(s) / ED Diagnoses Final diagnoses:  Flexor tenosynovitis of finger    Rx / DC Orders ED Discharge Orders     None        Tilden Fossa, MD 03/21/21 [phone]

## 2021-03-21 NOTE — Anesthesia Procedure Notes (Signed)
Procedure Name: MAC Date/Time: 03/21/2021 3:34 PM Performed by: Annamary Carolin, CRNA Pre-anesthesia Checklist: Patient identified, Suction available, Emergency Drugs available, Patient being monitored and Timeout performed Patient Re-evaluated:Patient Re-evaluated prior to induction Oxygen Delivery Method: Simple face mask Preoxygenation: Pre-oxygenation with 100% oxygen

## 2021-03-22 ENCOUNTER — Encounter (HOSPITAL_COMMUNITY): Payer: Self-pay | Admitting: General Surgery

## 2021-03-22 ENCOUNTER — Other Ambulatory Visit (HOSPITAL_COMMUNITY): Payer: Self-pay

## 2021-03-22 MED ORDER — TRAMADOL HCL 50 MG PO TABS
50.0000 mg | ORAL_TABLET | Freq: Four times a day (QID) | ORAL | 0 refills | Status: AC | PRN
  Filled 2021-03-22: qty 20, 5d supply, fill #0

## 2021-03-22 MED ORDER — PHENYLEPHRINE HCL-NACL 20-0.9 MG/250ML-% IV SOLN
INTRAVENOUS | Status: AC
  Filled 2021-03-22: qty 250

## 2021-03-22 MED ORDER — CEPHALEXIN 500 MG PO CAPS
500.0000 mg | ORAL_CAPSULE | Freq: Four times a day (QID) | ORAL | 0 refills | Status: AC
  Filled 2021-03-22: qty 40, 10d supply, fill #0

## 2021-03-22 NOTE — Anesthesia Postprocedure Evaluation (Signed)
Anesthesia Post Note  Patient: Vincent Sawyer  Procedure(s) Performed: IRRIGATION AND DEBRIDEMENT RIGHT LONG FINGER (Right: Arm Lower)     Patient location during evaluation: PACU Anesthesia Type: MAC Level of consciousness: awake and alert Pain management: pain level controlled Vital Signs Assessment: post-procedure vital signs reviewed and stable Respiratory status: spontaneous breathing, nonlabored ventilation, respiratory function stable and patient connected to nasal cannula oxygen Cardiovascular status: stable and blood pressure returned to baseline Postop Assessment: no apparent nausea or vomiting Anesthetic complications: no   No notable events documented.  Last Vitals:  Vitals:   03/22/21 0430 03/22/21 1219  BP: 103/68 112/80  Pulse: (!) 50 (!) 48  Resp: 18 16  Temp: 36.9 C 36.5 C  SpO2: 95% 98%    Last Pain:  Vitals:   03/22/21 1418  TempSrc:   PainSc: 2                  Velva Molinari S

## 2021-03-22 NOTE — Op Note (Signed)
NAME: LOEL, BETANCUR MEDICAL RECORD NO: [id number] ACCOUNT NO: [id number] DATE OF BIRTH: Nov 18, 1970 FACILITY: [facility] LOCATION: [facility] PHYSICIAN: Kimley Apsey C. Izora Ribas, MD  Operative Report   DATE OF PROCEDURE: 03/21/2021  PREOPERATIVE DIAGNOSIS:  Injection injury of the right long finger with associated flexor tenosynovitis.  POSTOPERATIVE DIAGNOSIS:  Injection injury of the right long finger with associated flexor tenosynovitis.  PROCEDURE:  Incision and drainage of flexor tendon sheath of the right long finger, irrigation and debridement of foreign body paint in the right long finger soft tissue.  INDICATIONS:  The patient is a 50 year old gentleman who sustained a high pressure paint injury to his finger a couple of days ago.  He presented to the emergency room with signs and symptoms of infection.  Risks, benefits, and alternatives of I and D  were discussed with the patient.  He agreed with this course of action.  Consent was obtained.  DESCRIPTION OF PROCEDURE:  The patient was taken to the operating room and placed supine on the operating room table.  IV sedation was administered.  Antibiotics were given.  Timeout was performed.  The right upper extremity was prepped and draped in  normal sterile fashion.  Local digital block was performed with plain Marcaine.  The wound was then evaluated.  There was an entry wound at the volar aspect of the right long finger at the distal DIP flexion crease.  There was obvious paint material in  the wound.  This entry point was actually excised sharply with a knife and full-thickness skin and subcutaneous tissue were irrigated, curetted and foreign body material was removed.  This was thoroughly irrigated with approximately a liter of both  saline and IrriSept solution.  The tissues became quite clean; however, there was still some residual pink staining.  It was evident that this injury did pierce into the distal part of the flexor tendon sheath, and  therefore, the tendon sheath was opened  more thoroughly for irrigation.  A counter incision was made over the A1 pulley in the palm.  Dissection was carried down to the pulley.  The pulley was released.  With an angiocatheter, irrigation fluid was instilled in a proximal to distal direction  irrigating the sheath thoroughly.  There was some turbid-type fluid suggesting infection.  After additional irrigation of all of the two wounds, the wounds were loosely approximated.  A small piece of Xeroform was placed in the wounds to allow open  drainage, and a sterile dressing was placed.  The patient tolerated the procedure well and was taken to the recovery room in stable condition.   [facility] D: 03/22/2021 12:51:54 pm T: 03/22/2021 2:38:00 pm  JOB: [id number]/ [id number]

## 2021-03-22 NOTE — Discharge Instructions (Signed)
Wash hand/finger wounds with soap and clean water 3x daily, gently squeeze any fluid out, then apply OTC antibiotic ointment to wounds and cover

## 2021-03-22 NOTE — Progress Notes (Signed)
Pt called to room informed pain medication not relieving pain, requesting change of pain medication. MD informed will continue to monitor pt

## 2021-03-29 NOTE — Discharge Summary (Signed)
NAME: Vincent Sawyer, Vincent Sawyer MEDICAL RECORD NO: [id number] ACCOUNT NO: [id number] DATE OF BIRTH: Sep 15, 1970 FACILITY: [facility] LOCATION: [facility] PHYSICIAN: Skylee Baird C. Izora Ribas, MD  Discharge Summary   DATE OF DISCHARGE: 03/22/2021  ADMISSION DIAGNOSES:  Injection injury of the right long finger with associated flexor tenosynovitis.  DISCHARGE DIAGNOSES:  Injection injury of the right long finger with associated flexor tenosynovitis.  PROCEDURES PERFORMED:  Incision and drainage of the right long finger.  Please see operative note for details.  HOSPITAL COURSE:  The patient was seen late in the evening of 08/01, admitted to the hospital early morning on 08/02, for an injection injury to the right long finger. I was consulted.  The patient was posted for the operating room and underwent incision  and drainage of the right long finger.  He was initially admitted to the hospitalist service.  He was placed in observation for 23 hours, placed on IV antibiotics.  The following day, postoperative day 1, his finger was evaluated by me.  The dressing  was changed.  The packing was removed.  The finger overall appeared improved without spreading of infection.  The patient was felt adequate for discharge home on oral antibiotics and wound care.  DISPOSITION:  The patient is taught how to change the dressing of the finger.  The patient is sent home with an antibiotic and pain medicine prescription.  The patient has followup with me.  He is to call the office for followup within one week's time.   SHW D: 03/29/2021 4:32:51 pm T: 03/29/2021 9:08:00 pm  JOB: [id number]/ [id number]

## 2022-02-04 IMAGING — CR DG FINGER MIDDLE 2+V*R*
3 series · 3 of 3 positions shown · non-contrast
Comparison: None.

CLINICAL DATA: Right long finger pain, wound

EXAM:
RIGHT MIDDLE FINGER 2+V

[finger ap]
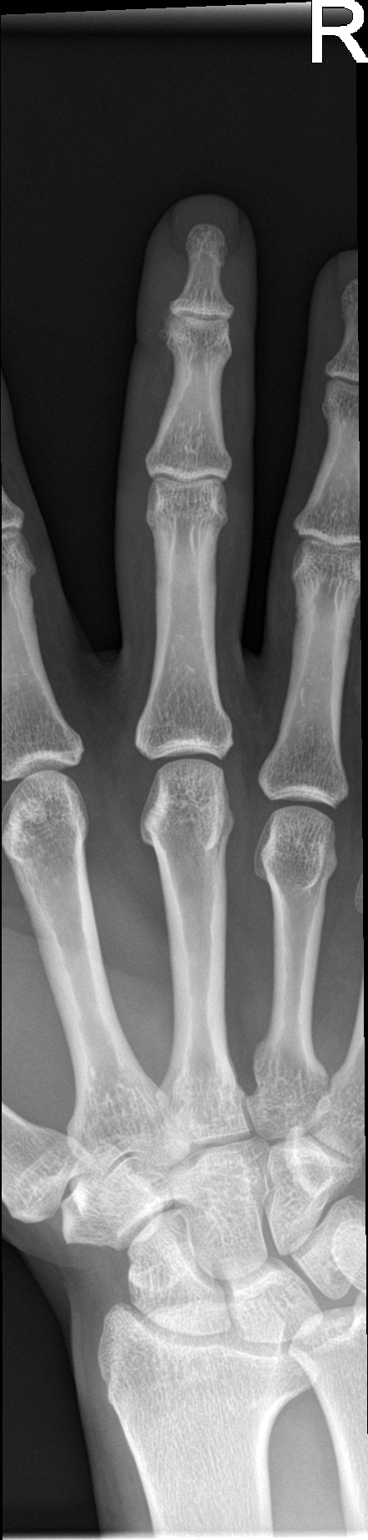

[finger obl]
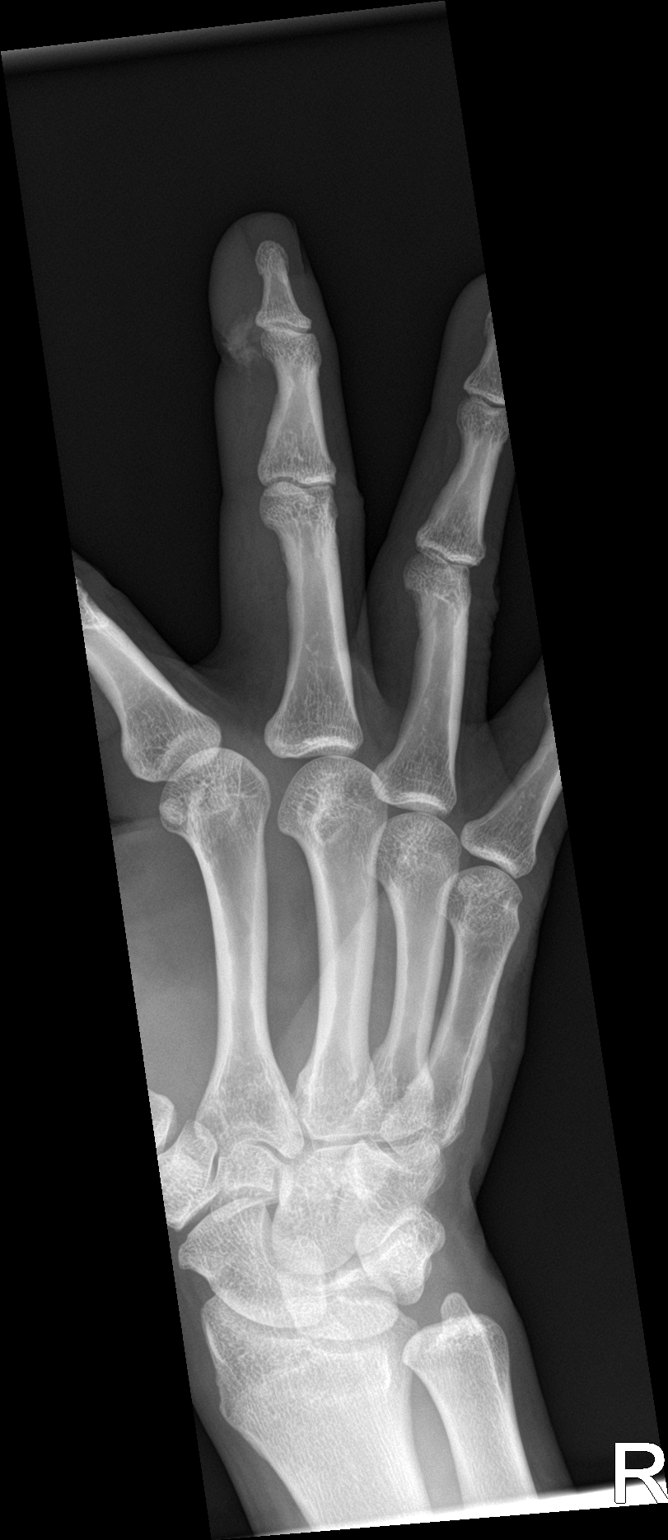

[finger lat]
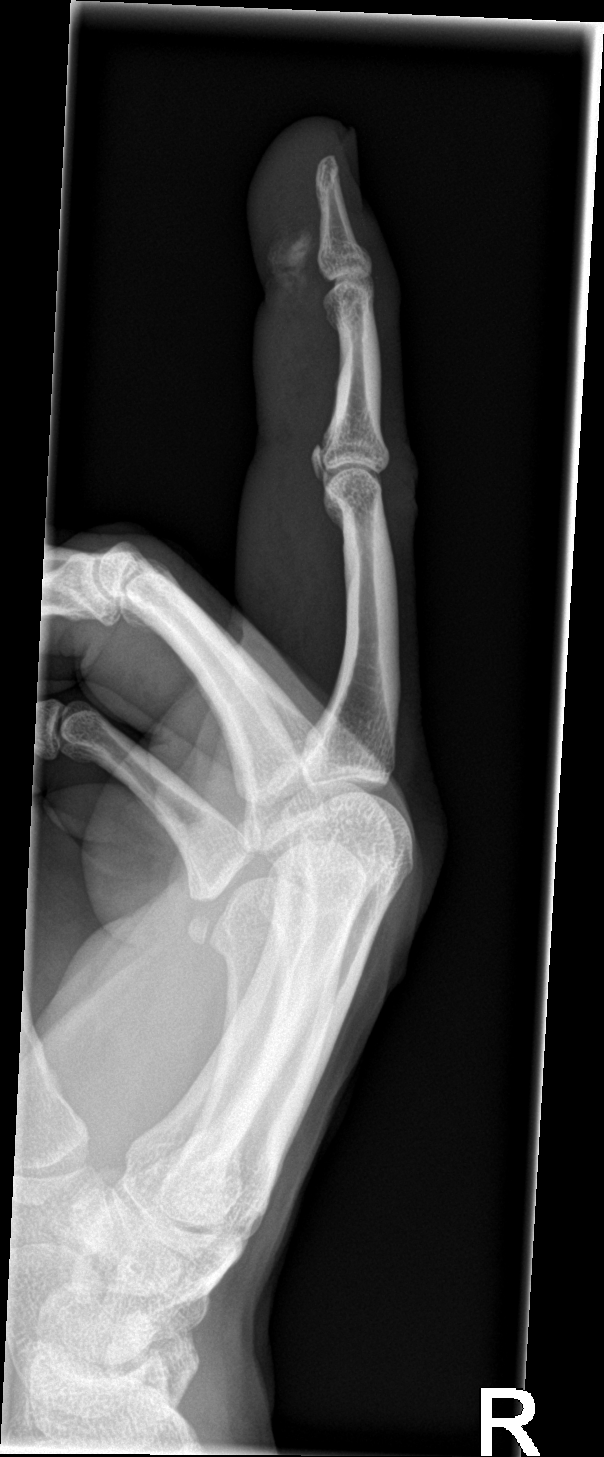

[3 of 3 positions shown; findings below may reference images not displayed]

FINDINGS: There is no evidence of fracture or dislocation. There is no
evidence of arthropathy or other focal bone abnormality. Faint
amorphous radiodensity within the soft tissues of the long finger at
the level of the DIP joint and distal phalanx. Mild diffuse soft
tissue swelling of the long finger.
IMPRESSION: 1. Soft tissue swelling without evidence of acute fracture or
dislocation.
2. Faint amorphous radiodensity within the soft tissues of the long
finger at the level of the DIP joint and distal phalanx, suggestive
of foreign body/paint within the soft tissues.

## 2022-04-02 ENCOUNTER — Other Ambulatory Visit: Payer: Self-pay

## 2023-08-28 ENCOUNTER — Inpatient Hospital Stay: Payer: Self-pay

## 2023-08-28 ENCOUNTER — Telehealth: Payer: Self-pay

## 2023-08-28 ENCOUNTER — Inpatient Hospital Stay: Payer: Self-pay | Attending: Oncology | Admitting: Oncology

## 2023-08-28 VITALS — BP 115/80 | HR 62 | Temp 97.3°F | Resp 16 | Ht 67.0 in | Wt 168.8 lb

## 2023-08-28 DIAGNOSIS — K760 Fatty (change of) liver, not elsewhere classified: Secondary | ICD-10-CM | POA: Insufficient documentation

## 2023-08-28 DIAGNOSIS — D696 Thrombocytopenia, unspecified: Secondary | ICD-10-CM | POA: Insufficient documentation

## 2023-08-28 DIAGNOSIS — R7303 Prediabetes: Secondary | ICD-10-CM | POA: Insufficient documentation

## 2023-08-28 DIAGNOSIS — R079 Chest pain, unspecified: Secondary | ICD-10-CM | POA: Insufficient documentation

## 2023-08-28 DIAGNOSIS — R1011 Right upper quadrant pain: Secondary | ICD-10-CM

## 2023-08-28 DIAGNOSIS — K219 Gastro-esophageal reflux disease without esophagitis: Secondary | ICD-10-CM | POA: Insufficient documentation

## 2023-08-28 DIAGNOSIS — Z7182 Exercise counseling: Secondary | ICD-10-CM | POA: Insufficient documentation

## 2023-08-28 LAB — COMPREHENSIVE METABOLIC PANEL
ALT: 20 U/L (ref 0–44)
AST: 14 U/L — ABNORMAL LOW (ref 15–41)
Albumin: 4.4 g/dL (ref 3.5–5.0)
Alkaline Phosphatase: 66 U/L (ref 38–126)
Anion gap: 6 (ref 5–15)
BUN: 19 mg/dL (ref 6–20)
CO2: 29 mmol/L (ref 22–32)
Calcium: 9.2 mg/dL (ref 8.9–10.3)
Chloride: 104 mmol/L (ref 98–111)
Creatinine, Ser: 0.89 mg/dL (ref 0.61–1.24)
GFR, Estimated: >60 mL/min (ref >60)
Glucose, Bld: 85 mg/dL (ref 70–99)
Potassium: 3.8 mmol/L (ref 3.5–5.1)
Sodium: 139 mmol/L (ref 135–145)
Total Bilirubin: 0.4 mg/dL (ref 0.0–1.2)
Total Protein: 7.3 g/dL (ref 6.5–8.1)

## 2023-08-28 LAB — CBC WITH DIFFERENTIAL/PLATELET
Abs Immature Granulocytes: 0.01 10*3/uL (ref 0.00–0.07)
Basophils Absolute: 0 10*3/uL (ref 0.0–0.1)
Basophils Relative: 1 %
Eosinophils Absolute: 0.2 10*3/uL (ref 0.0–0.5)
Eosinophils Relative: 2 %
HCT: 46.9 % (ref 39.0–52.0)
Hemoglobin: 15.9 g/dL (ref 13.0–17.0)
Immature Granulocytes: 0 %
Lymphocytes Relative: 29 %
Lymphs Abs: 2 10*3/uL (ref 0.7–4.0)
MCH: 28.5 pg (ref 26.0–34.0)
MCHC: 33.9 g/dL (ref 30.0–36.0)
MCV: 84.1 fL (ref 80.0–100.0)
Monocytes Absolute: 0.8 10*3/uL (ref 0.1–1.0)
Monocytes Relative: 11 %
Neutro Abs: 3.9 10*3/uL (ref 1.7–7.7)
Neutrophils Relative %: 57 %
Platelets: 137 10*3/uL — ABNORMAL LOW (ref 150–400)
RBC: 5.58 MIL/uL (ref 4.22–5.81)
RDW: 13.3 % (ref 11.5–15.5)
WBC: 6.9 10*3/uL (ref 4.0–10.5)
nRBC: 0 % (ref 0.0–0.2)

## 2023-08-28 LAB — IRON AND IRON BINDING CAPACITY (CC-WL,HP ONLY)
Iron: 93 ug/dL (ref 45–182)
Saturation Ratios: 27 % (ref 17.9–39.5)
TIBC: 346 ug/dL (ref 250–450)
UIBC: 253 ug/dL (ref 117–376)

## 2023-08-28 LAB — LACTATE DEHYDROGENASE: LDH: 125 U/L (ref 98–192)

## 2023-08-28 LAB — HIV ANTIBODY (ROUTINE TESTING W REFLEX): HIV Screen 4th Generation wRfx: NONREACTIVE

## 2023-08-28 LAB — HEPATITIS PANEL, ACUTE
HCV Ab: NONREACTIVE
Hep A IgM: NONREACTIVE
Hep B C IgM: NONREACTIVE
Hepatitis B Surface Ag: NONREACTIVE

## 2023-08-28 LAB — PROTIME-INR
INR: 1 (ref 0.8–1.2)
Prothrombin Time: 13.1 s (ref 11.4–15.2)

## 2023-08-28 LAB — FOLATE: Folate: 15.4 ng/mL (ref >5.9)

## 2023-08-28 LAB — VITAMIN B12: Vitamin B-12: 374 pg/mL (ref 180–914)

## 2023-08-28 LAB — APTT: aPTT: 28 s (ref 24–36)

## 2023-08-28 LAB — TSH: TSH: 0.596 u[IU]/mL (ref 0.350–4.500)

## 2023-08-28 LAB — FERRITIN: Ferritin: 51 ng/mL (ref 24–336)

## 2023-08-28 NOTE — Telephone Encounter (Signed)
 Via [employer], Patient made aware of US  of Abdomen test tomorrow morning to check in at 8:15 am. Patient aslo instructed to not eat or drink anything after midnight. Patient understood all instructions per interpreter, and was agreeable to US .

## 2023-08-28 NOTE — Assessment & Plan Note (Signed)
 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  No major bleeding issues. Possible causes include previous alcohol use, dietary changes, and potential medication effects. No evidence of major bone marrow problems. Differential diagnosis includes low B12, folic acid , iron, and liver/spleen issues. Explained that alcohol, low B12, folic acid , and iron can decrease platelet count. Current platelet level is not worrisome for spontaneous bleeding.  -Labs today showed stable platelet count of 437,000.  White count and platelet count remain within normal limits.  Iron studies, PT, PTT, CMP are all unremarkable.  -We did request B12, folate, hepatitis panel, HIV, ANA testing to rule out other etiologies.  -No hematological intervention would be warranted at current platelet counts.  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  RTC in 4 weeks for follow-up with repeat labs.

## 2023-08-28 NOTE — Progress Notes (Signed)
 [facility]  HEMATOLOGY CLINIC CONSULTATION NOTE   PATIENT NAME: Vincent Sawyer   MR#: [id number] DOB: 1971/03/13  DATE OF SERVICE: 08/28/2023  REFERRING PHYSICIAN  Mason Haywood LABOR, PA-C   Patient Care Team: Mason Haywood LABOR DEVONNA as PCP - General (Physician Assistant)   REASON FOR CONSULTATION/ CHIEF COMPLAINT:  Evaluation of thrombocytopenia  ASSESSMENT & PLAN:  [name] is a 53 y.o. Hispanic gentleman with a past medical history of prediabetes, mild fatty liver, was referred to our service for evaluation of thrombocytopenia.  Thrombocytopenia (HCC) Chronic, mild thrombocytopenia at least dating back to 2022 with platelet count in the range of 122,000 to 140,000.  No major bleeding issues. Possible causes include previous alcohol use, dietary changes, and potential medication effects. No evidence of major bone marrow problems. Differential diagnosis includes low B12, folic acid , iron, and liver/spleen issues. Explained that alcohol, low B12, folic acid , and iron can decrease platelet count. Current platelet level is not worrisome for spontaneous bleeding.  -Labs today showed stable platelet count of 437,000.  White count and platelet count remain within normal limits.  Iron studies, PT, PTT, CMP are all unremarkable.  -We did request B12, folate, hepatitis panel, HIV, ANA testing to rule out other etiologies.  -No hematological intervention would be warranted at current platelet counts.  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  RTC in 4 weeks for follow-up with repeat labs.  RUQ pain Intermittent severe abdominal pain (10/10) radiating upwards, exacerbated by greasy foods. Recurrent over the past five years, worsening over the past month. Differential diagnosis includes gallbladder issues, gastritis, and fatty liver affecting the spleen. Explained that gallbladder issues can cause pain radiating upwards and fatty liver can  cause spleen enlargement, potentially affecting platelet count. - Order abdominal ultrasound - Follow up in four weeks  Prediabetes Managed with dietary changes and weight loss. Weight reduced from 195 lbs to 145 lbs, recently regained to 160 lbs. Explained benefits of dietary changes and weight loss for managing prediabetes. - Continue monitoring blood glucose levels - Encourage adherence to diet and exercise regimen   I reviewed lab results and outside records for this visit and discussed relevant results with the patient. Diagnosis, plan of care and treatment options were also discussed in detail with the patient. Opportunity provided to ask questions and answers provided to his apparent satisfaction. Provided instructions to call our clinic with any problems, questions or concerns prior to return visit. I recommended to continue follow-up with PCP and sub-specialists. He verbalized understanding and agreed with the plan. No barriers to learning was detected.  Chinita Patten, MD 08/28/2023 1:34 PM  [facility] - A DEPT OF [facility] [address] [place] [address] Dept: [phone] Dept Fax: [phone]   HISTORY OF PRESENTING ILLNESS:   [name] is a 53 y.o. gentleman was referred to us  for evaluation of thrombocytopenia.  Interview conducted with the help of Spanish interpreter.  On 07/05/2023, labs at his PCPs office showed platelet count of 140,000.  White count was 6200 with normal differential.  Hemoglobin 15.2, MCV 87.  CMP was unremarkable.  Previously labs in February 2024 showed platelet count of 127,000.  Normal white count and hemoglobin.  Since he has had fluctuating platelet count, referral was sent to us  for further evaluation of thrombocytopenia.  The patient denies taking any prescription medications  but occasionally uses over-the-counter pain relievers and Alka-Seltzer for reflux. He  stopped consuming alcohol when prediabetes was diagnosed.  In addition to the low platelet count, the patient reports experiencing pain that radiates from the abdominal area to the chest. This pain, rated as a 10 on a scale of 0 to 10, has been occurring for the past month and was previously experienced five years ago. The pain is particularly noticeable after consuming greasy food and tends to subside after a couple of hours. The patient also reports some gum bleeding when brushing teeth.  He denies recent bruising/bleeding, such as spontaneous epistaxis, hematuria, melena or hematochezia  The patient denies history of liver disease, exposure to heparin, history of cardiac murmur/prior cardiovascular surgery or recent new medications  He denies prior blood or platelet transfusions.   MEDICAL HISTORY Past Medical History:  Diagnosis Date   Prediabetes      SURGICAL HISTORY Past Surgical History:  Procedure Laterality Date   I & D EXTREMITY Right 03/21/2021   Procedure: IRRIGATION AND DEBRIDEMENT RIGHT LONG FINGER;  Surgeon: Lorretta Dess, MD;  Location: [facility] OR;  Service: Plastics;  Laterality: Right;     SOCIAL HISTORY: He reports that he has never smoked. He has never used smokeless tobacco. He reports that he does not drink alcohol and does not use drugs. Social History   Socioeconomic History   Marital status: Single    Spouse name: Not on file   Number of children: Not on file   Years of education: Not on file   Highest education level: Not on file  Occupational History   Not on file  Tobacco Use   Smoking status: Never   Smokeless tobacco: Never  Substance and Sexual Activity   Alcohol use: No   Drug use: No   Sexual activity: Not on file  Other Topics Concern   Not on file  Social History Narrative   Not on file   Social Drivers of Health   Financial Resource Strain: Not on file  Food Insecurity: No Food Insecurity (08/28/2023)   Hunger Vital Sign    Worried  About Running Out of Food in the Last Year: Never true    Ran Out of Food in the Last Year: Never true  Transportation Needs: No Transportation Needs (08/28/2023)   PRAPARE - Administrator, Civil Service (Medical): No    Lack of Transportation (Non-Medical): No  Physical Activity: Not on file  Stress: Not on file  Social Connections: Not on file  Intimate Partner Violence: Not At Risk (08/28/2023)   Humiliation, Afraid, Rape, and Kick questionnaire    Fear of Current or Ex-Partner: No    Emotionally Abused: No    Physically Abused: No    Sexually Abused: No    FAMILY HISTORY: His family history is not on file.  CURRENT MEDICATIONS   Current Outpatient Medications  Medication Instructions   acetaminophen  (TYLENOL ) 500 mg, Every 6 hours PRN   cephALEXin  (KEFLEX ) 500 mg, Oral, 4 times daily     ALLERGIES  He has no known allergies.  REVIEW OF SYSTEMS:  Review of Systems  Gastrointestinal:  Positive for abdominal pain.     Rest of the pertinent review of systems is unremarkable except as mentioned above in HPI.  PHYSICAL EXAMINATION:  ECOG PERFORMANCE STATUS: 1 - Symptomatic but completely ambulatory  Vitals:   08/28/23 1036  BP: 115/80  Pulse: 62  Resp: 16  Temp: (!) 97.3 F (36.3 C)  SpO2: 100%   Filed Weights   08/28/23 1036  Weight: 168 lb 12.8 oz (76.6 kg)    Physical Exam Constitutional:      General: He is not in acute distress.    Appearance: Normal appearance.  HENT:     Head: Normocephalic and atraumatic.  Eyes:     General: No scleral icterus.    Conjunctiva/sclera: Conjunctivae normal.  Cardiovascular:     Rate and Rhythm: Normal rate and regular rhythm.     Heart sounds: Normal heart sounds.  Pulmonary:     Effort: Pulmonary effort is normal.     Breath sounds: Normal breath sounds.  Abdominal:     General: There is no distension.     Palpations: Abdomen is soft. There is no mass.  Musculoskeletal:     Right lower leg: No  edema.     Left lower leg: No edema.  Neurological:     General: No focal deficit present.     Mental Status: He is alert and oriented to person, place, and time.  Psychiatric:        Mood and Affect: Mood normal.        Behavior: Behavior normal.        Thought Content: Thought content normal.     LABORATORY DATA:   I have reviewed the data as listed.  Results for orders placed or performed in visit on 08/28/23  APTT  Result Value Ref Range   aPTT 28 24 - 36 seconds  Protime-INR  Result Value Ref Range   Prothrombin Time 13.1 11.4 - 15.2 seconds   INR 1.0 0.8 - 1.2  Iron and Iron Binding Capacity ([facility] only)  Result Value Ref Range   Iron 93 45 - 182 ug/dL   TIBC 653 749 - 549 ug/dL   Saturation Ratios 27 17.9 - 39.5 %   UIBC 253 117 - 376 ug/dL  Lactate dehydrogenase  Result Value Ref Range   LDH 125 98 - 192 U/L  Comprehensive metabolic panel  Result Value Ref Range   Sodium 139 135 - 145 mmol/L   Potassium 3.8 3.5 - 5.1 mmol/L   Chloride 104 98 - 111 mmol/L   CO2 29 22 - 32 mmol/L   Glucose, Bld 85 70 - 99 mg/dL   BUN 19 6 - 20 mg/dL   Creatinine, Ser 9.10 0.61 - 1.24 mg/dL   Calcium 9.2 8.9 - 89.6 mg/dL   Total Protein 7.3 6.5 - 8.1 g/dL   Albumin 4.4 3.5 - 5.0 g/dL   AST 14 (L) 15 - 41 U/L   ALT 20 0 - 44 U/L   Alkaline Phosphatase 66 38 - 126 U/L   Total Bilirubin 0.4 0.0 - 1.2 mg/dL   GFR, Estimated >39 >39 mL/min   Anion gap 6 5 - 15  CBC with Differential/Platelet  Result Value Ref Range   WBC 6.9 4.0 - 10.5 K/uL   RBC 5.58 4.22 - 5.81 MIL/uL   Hemoglobin 15.9 13.0 - 17.0 g/dL   HCT 53.0 60.9 - 47.9 %   MCV 84.1 80.0 - 100.0 fL   MCH 28.5 26.0 - 34.0 pg   MCHC 33.9 30.0 - 36.0 g/dL   RDW 86.6 88.4 - 84.4 %   Platelets 137 (L) 150 - 400 K/uL   nRBC 0.0 0.0 - 0.2 %   Neutrophils Relative % 57 %   Neutro Abs 3.9 1.7 - 7.7 K/uL   Lymphocytes Relative 29 %  Lymphs Abs 2.0 0.7 - 4.0 K/uL   Monocytes Relative 11 %   Monocytes Absolute 0.8  0.1 - 1.0 K/uL   Eosinophils Relative 2 %   Eosinophils Absolute 0.2 0.0 - 0.5 K/uL   Basophils Relative 1 %   Basophils Absolute 0.0 0.0 - 0.1 K/uL   WBC Morphology MORPHOLOGY UNREMARKABLE    RBC Morphology MORPHOLOGY UNREMARKABLE    Smear Review LARGE PLTS    Immature Granulocytes 0 %   Abs Immature Granulocytes 0.01 0.00 - 0.07 K/uL    RADIOGRAPHIC STUDIES:  No pertinent imaging studies available to review.  Orders Placed This Encounter  Procedures   US  Abdomen Limited RUQ (LIVER/GB)    Standing Status:   Future    Expected Date:   08/28/2023    Expiration Date:   08/27/2024    Reason for Exam (SYMPTOM  OR DIAGNOSIS REQUIRED):   RUQ pain, concern for cholecystitis    Preferred imaging location?:   [facility]   CBC with Differential/Platelet    Standing Status:   Future    Number of Occurrences:   1    Expiration Date:   08/27/2024   Comprehensive metabolic panel    Standing Status:   Future    Number of Occurrences:   1    Expiration Date:   08/27/2024   Lactate dehydrogenase    Standing Status:   Future    Number of Occurrences:   1    Expiration Date:   08/27/2024   Iron and Iron Binding Capacity ([facility] only)    Standing Status:   Future    Number of Occurrences:   1    Expiration Date:   08/27/2024   Ferritin    Standing Status:   Future    Number of Occurrences:   1    Expiration Date:   08/27/2024   Vitamin B12    Standing Status:   Future    Number of Occurrences:   1    Expiration Date:   08/27/2024   Folate    Standing Status:   Future    Number of Occurrences:   1    Expiration Date:   08/27/2024   TSH    Standing Status:   Future    Number of Occurrences:   1    Expiration Date:   08/27/2024   Protime-INR    Standing Status:   Future    Number of Occurrences:   1    Expiration Date:   08/27/2024   APTT    Standing Status:   Future    Number of Occurrences:   1    Expiration Date:   08/27/2024   ANA w/Reflex if Positive    Standing Status:   Future     Number of Occurrences:   1    Expiration Date:   08/27/2024   Hepatitis panel, acute    Standing Status:   Future    Number of Occurrences:   1    Expiration Date:   08/27/2024   HIV antibody (with reflex)    Standing Status:   Future    Number of Occurrences:   1    Expiration Date:   08/27/2024    Future Appointments  Date Time Provider Department Center  08/29/2023  8:30 AM [facility]  2 [facility]  [place]  09/25/2023 10:45 AM [facility] None  09/25/2023 11:20 AM Arnetra Terris, MD [facility] None     I spent a  total of 55 minutes during this encounter with the patient including review of chart and various tests results, discussions about plan of care and coordination of care plan.  This document was completed utilizing speech recognition software. Grammatical errors, random word insertions, pronoun errors, and incomplete sentences are an occasional consequence of this system due to software limitations, ambient noise, and hardware issues. Any formal questions or concerns about the content, text or information contained within the body of this dictation should be directly addressed to the provider for clarification.

## 2023-08-28 NOTE — Assessment & Plan Note (Signed)
 Managed with dietary changes and weight loss. Weight reduced from 195 lbs to 145 lbs, recently regained to 160 lbs. Explained benefits of dietary changes and weight loss for managing prediabetes. - Continue monitoring blood glucose levels - Encourage adherence to diet and exercise regimen

## 2023-08-28 NOTE — Assessment & Plan Note (Signed)
 Intermittent severe abdominal pain (10/10) radiating upwards, exacerbated by greasy foods. Recurrent over the past five years, worsening over the past month. Differential diagnosis includes gallbladder issues, gastritis, and fatty liver affecting the spleen. Explained that gallbladder issues can cause pain radiating upwards and fatty liver can cause spleen enlargement, potentially affecting platelet count. - Order abdominal ultrasound - Follow up in four weeks

## 2023-08-29 ENCOUNTER — Ambulatory Visit (HOSPITAL_COMMUNITY)
Admission: RE | Admit: 2023-08-29 | Discharge: 2023-08-29 | Disposition: A | Discharge status: Home / Self Care | Payer: Self-pay | Source: Ambulatory Visit | Attending: Oncology | Admitting: Oncology

## 2023-08-29 DIAGNOSIS — R1011 Right upper quadrant pain: Secondary | ICD-10-CM | POA: Insufficient documentation

## 2023-08-30 LAB — ANA W/REFLEX IF POSITIVE: Anti Nuclear Antibody (ANA): NEGATIVE

## 2023-09-25 ENCOUNTER — Inpatient Hospital Stay: Payer: Self-pay | Attending: Oncology

## 2023-09-25 ENCOUNTER — Encounter: Payer: Self-pay | Admitting: Oncology

## 2023-09-25 ENCOUNTER — Inpatient Hospital Stay (HOSPITAL_BASED_OUTPATIENT_CLINIC_OR_DEPARTMENT_OTHER): Payer: Self-pay | Admitting: Oncology

## 2023-09-25 ENCOUNTER — Other Ambulatory Visit: Payer: Self-pay | Admitting: Oncology

## 2023-09-25 VITALS — BP 116/66 | HR 57 | Temp 98.0°F | Resp 16 | Wt 167.0 lb

## 2023-09-25 DIAGNOSIS — D696 Thrombocytopenia, unspecified: Secondary | ICD-10-CM

## 2023-09-25 DIAGNOSIS — Z0000 Encounter for general adult medical examination without abnormal findings: Secondary | ICD-10-CM | POA: Insufficient documentation

## 2023-09-25 DIAGNOSIS — R7303 Prediabetes: Secondary | ICD-10-CM | POA: Insufficient documentation

## 2023-09-25 DIAGNOSIS — R1011 Right upper quadrant pain: Secondary | ICD-10-CM

## 2023-09-25 LAB — CBC WITH DIFFERENTIAL/PLATELET
Abs Immature Granulocytes: 0.01 10*3/uL (ref 0.00–0.07)
Basophils Absolute: 0 10*3/uL (ref 0.0–0.1)
Basophils Relative: 0 %
Eosinophils Absolute: 0.1 10*3/uL (ref 0.0–0.5)
Eosinophils Relative: 2 %
HCT: 44.4 % (ref 39.0–52.0)
Hemoglobin: 14.8 g/dL (ref 13.0–17.0)
Immature Granulocytes: 0 %
Lymphocytes Relative: 27 %
Lymphs Abs: 1.5 10*3/uL (ref 0.7–4.0)
MCH: 28.3 pg (ref 26.0–34.0)
MCHC: 33.3 g/dL (ref 30.0–36.0)
MCV: 84.9 fL (ref 80.0–100.0)
Monocytes Absolute: 0.5 10*3/uL (ref 0.1–1.0)
Monocytes Relative: 9 %
Neutro Abs: 3.5 10*3/uL (ref 1.7–7.7)
Neutrophils Relative %: 62 %
Platelets: 150 10*3/uL (ref 150–400)
RBC: 5.23 MIL/uL (ref 4.22–5.81)
RDW: 13.3 % (ref 11.5–15.5)
WBC: 5.7 10*3/uL (ref 4.0–10.5)
nRBC: 0 % (ref 0.0–0.2)

## 2023-09-25 NOTE — Progress Notes (Signed)
 [facility]  HEMATOLOGY CLINIC PROGRESS NOTE  PATIENT NAME: Vincent Sawyer   MR#: [id number] DOB: Nov 24, 1970  Patient Care Team: Vincent Sawyer Vincent Sawyer as PCP - General (Physician Assistant)  Date of visit: 09/25/2023   ASSESSMENT & PLAN:   Vincent Sawyer is a 53 y.o. Hispanic gentleman with a past medical history of prediabetes, mild fatty liver, was referred to our service in January 2025 for evaluation of thrombocytopenia.  Grossly negative hematological workup.  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  Thrombocytopenia (HCC) Chronic, mild thrombocytopenia at least dating back to 2022 with platelet count in the range of 122,000 to 140,000.  No major bleeding issues. Possible causes include previous alcohol use, dietary changes, and potential medication effects. No evidence of major bone marrow problems. Differential diagnosis includes low B12, folic acid , iron, and liver/spleen issues. Explained that alcohol consumption can decrease platelet count. Current platelet level is not worrisome for spontaneous bleeding.   On his initial consultation with us  on 08/28/2023, labs showed stable platelet count of 137,000.  White count and red count were within normal limits.  Iron studies, PT, PTT, CMP, B12, folate were all unremarkable.  HIV and hepatitis panel were negative.  ANA was also negative.  Repeat labs today showed normal platelet count of 150,000.  White count 5700 with normal differential.  Hemoglobin 14.8, MCV 85.  Unremarkable CBCD today.  He was advised to avoid NSAIDs or aspirin which can contribute to thrombocytopenia.  He has quit alcohol completely and he was congratulated on the effort.   No hematological intervention would be warranted at current platelet counts.  It is possible that he has some very mild ITP which is causing intermittent mild thrombocytopenia.  We will continue monitoring alone.  RUQ pain Intermittent severe abdominal pain (10/10) radiating upwards,  exacerbated by greasy foods. Recurrent over the past five years, worsening over the past month. Differential diagnosis includes gallbladder issues, gastritis.   Ultrasound of the abdomen on 08/29/2023 showed no abnormalities  Recommended following up with PCP for referral to GI for further evaluation and consideration of EGD.  Also patient never had screening colonoscopy.  Healthcare maintenance Discussed the importance of colorectal cancer screening. Recommended discussing with primary care physician about scheduling a colonoscopy, since he never had one.   I spent a total of 20 minutes during this encounter with the patient including review of chart and various tests results, discussions about plan of care and coordination of care plan.  I reviewed lab results and outside records for this visit and discussed relevant results with the patient. Diagnosis, plan of care and treatment options were also discussed in detail with the patient. Opportunity provided to ask questions and answers provided to his apparent satisfaction. Provided instructions to call our clinic with any problems, questions or concerns prior to return visit. I recommended to continue follow-up with PCP and sub-specialists. He verbalized understanding and agreed with the plan. No barriers to learning was detected.  Chinita Patten, MD  09/25/2023 11:52 AM  [facility] - A DEPT OF [facility] [address] [place] [address] Dept: [phone] Dept Fax: [phone]   CHIEF COMPLAINT/ REASON FOR VISIT:  Follow-up for chronic, mild thrombocytopenia at least since 2022.  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  INTERVAL HISTORY:  Discussed the use of AI scribe software for clinical note transcription with the patient, who gave verbal consent to proceed.   Patient returns  for follow-up.  He reports intermittent, severe abdominal pain that occurs after eating, particularly after  consuming heavy or spicy foods. The pain, described as 'horrible,' wakes him up at night and is located in the stomach area. He reports that his mother has a similar pain triggered by hot sauce. He has been self-managing the pain with Alka-Seltzer and Tums, which provide some relief. He also reports bloating and gas.  In addition to the abdominal pain, he has a history of thrombocytopenia. His platelet count has fluctuated, with a previous count of 122,000 and a recent count of 150,000, which is within the normal range. He has not been taking any medications that could potentially lower his platelet count, such as Aleve, Advil , or aspirin.  SUMMARY OF HEMATOLOGIC HISTORY:  On 07/05/2023, labs at his PCPs office showed platelet count of 140,000.  White count was 6200 with normal differential.  Hemoglobin 15.2, MCV 87.  CMP was unremarkable.  Previously labs in February 2024 showed platelet count of 127,000.  Normal white count and hemoglobin.  Since he has had fluctuating platelet count, referral was sent to us  for further evaluation of thrombocytopenia.   The patient denies taking any prescription medications but occasionally uses over-the-counter pain relievers and Alka-Seltzer for reflux. He stopped consuming alcohol when prediabetes was diagnosed.   The patient also reported some gum bleeding when brushing teeth. He denied any other recent bruising/bleeding, such as spontaneous epistaxis, hematuria, melena or hematochezia   The patient denied history of liver disease, exposure to heparin, history of cardiac murmur/prior cardiovascular surgery or recent new medications. He denied prior blood or platelet transfusions.  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  No major bleeding issues. Possible causes include previous alcohol use, dietary changes, and potential medication effects. No evidence of major bone marrow problems.  Differential diagnosis includes low B12, folic acid , iron, and liver/spleen issues. Explained that alcohol consumption can decrease platelet count. Current platelet level is not worrisome for spontaneous bleeding.   On his initial consultation with us  on 08/28/2023, labs showed stable platelet count of 137,000.  White count and red count remain within normal limits.  Iron studies, PT, PTT, CMP, B12, folate were all unremarkable.  HIV and hepatitis panel were negative.  ANA was also negative.   No hematological intervention would be warranted at current platelet counts.  It is possible that he has some very mild ITP.  We will continue monitoring alone.  I have reviewed the past medical history, past surgical history, social history and family history with the patient and they are unchanged from previous note.  ALLERGIES: He has no known allergies.  MEDICATIONS:  Current Outpatient Medications  Medication Sig Dispense Refill   acetaminophen  (TYLENOL ) 500 MG tablet Take 500 mg by mouth every 6 (six) hours as needed for moderate pain or fever.     cephALEXin  (KEFLEX ) 500 MG capsule Take 1 capsule (500 mg total) by mouth 4 (four) times daily. 40 capsule 0   No current facility-administered medications for this visit.     REVIEW OF SYSTEMS:    ROS  All other pertinent systems were reviewed with the patient and are negative.  PHYSICAL EXAMINATION:    Onc Performance Status - 09/25/23 1100       ECOG Perf Status   ECOG Perf Status Fully active, able to carry on all pre-disease performance without restriction      KPS SCALE  KPS % SCORE Normal, no compliants, no evidence of disease             Vitals:   09/25/23 1149  BP: 116/66  Pulse: (!) 57  Resp: 16  Temp: 98 F (36.7 C)  SpO2: 97%   Filed Weights   09/25/23 1149  Weight: 167 lb (75.8 kg)    Physical Exam Constitutional:      General: He is not in acute distress.    Appearance: Normal appearance.  HENT:      Head: Normocephalic and atraumatic.  Eyes:     General: No scleral icterus.    Conjunctiva/sclera: Conjunctivae normal.  Cardiovascular:     Rate and Rhythm: Normal rate and regular rhythm.     Heart sounds: Normal heart sounds.  Pulmonary:     Effort: Pulmonary effort is normal.     Breath sounds: Normal breath sounds.  Abdominal:     General: There is no distension.     Palpations: Abdomen is soft. There is no mass.  Musculoskeletal:     Right lower leg: No edema.     Left lower leg: No edema.  Neurological:     General: No focal deficit present.     Mental Status: He is alert and oriented to person, place, and time.  Psychiatric:        Mood and Affect: Mood normal.        Behavior: Behavior normal.        Thought Content: Thought content normal.     LABORATORY DATA:   I have reviewed the data as listed.  Results for orders placed or performed in visit on 09/25/23  CBC with Differential/Platelet  Result Value Ref Range   WBC 5.7 4.0 - 10.5 K/uL   RBC 5.23 4.22 - 5.81 MIL/uL   Hemoglobin 14.8 13.0 - 17.0 g/dL   HCT 55.5 60.9 - 47.9 %   MCV 84.9 80.0 - 100.0 fL   MCH 28.3 26.0 - 34.0 pg   MCHC 33.3 30.0 - 36.0 g/dL   RDW 86.6 88.4 - 84.4 %   Platelets 150 150 - 400 K/uL   nRBC 0.0 0.0 - 0.2 %   Neutrophils Relative % 62 %   Neutro Abs 3.5 1.7 - 7.7 K/uL   Lymphocytes Relative 27 %   Lymphs Abs 1.5 0.7 - 4.0 K/uL   Monocytes Relative 9 %   Monocytes Absolute 0.5 0.1 - 1.0 K/uL   Eosinophils Relative 2 %   Eosinophils Absolute 0.1 0.0 - 0.5 K/uL   Basophils Relative 0 %   Basophils Absolute 0.0 0.0 - 0.1 K/uL   Immature Granulocytes 0 %   Abs Immature Granulocytes 0.01 0.00 - 0.07 K/uL       Component Value Date/Time   NA 139 08/28/2023 1127   K 3.8 08/28/2023 1127   CL 104 08/28/2023 1127   CO2 29 08/28/2023 1127   GLUCOSE 85 08/28/2023 1127   BUN 19 08/28/2023 1127   CREATININE 0.89 08/28/2023 1127   CALCIUM 9.2 08/28/2023 1127   PROT 7.3 08/28/2023  1127   ALBUMIN 4.4 08/28/2023 1127   AST 14 (L) 08/28/2023 1127   ALT 20 08/28/2023 1127   ALKPHOS 66 08/28/2023 1127   BILITOT 0.4 08/28/2023 1127   GFRNONAA >60 08/28/2023 1127     RADIOGRAPHIC STUDIES:  I have personally reviewed the radiological images as listed and agree with the findings in the report.  US  Abdomen Limited RUQ (LIVER/GB) Result Date:  08/29/2023 CLINICAL DATA:  Right upper quadrant abdomen pain. EXAM: ULTRASOUND ABDOMEN LIMITED RIGHT UPPER QUADRANT COMPARISON:  None Available. FINDINGS: Gallbladder: No gallstones or wall thickening visualized. No sonographic Murphy sign noted by sonographer. Common bile duct: Diameter: 4.4 mm. Liver: No focal lesion identified. Within normal limits in parenchymal echogenicity. Portal vein is patent on color Doppler imaging with normal direction of blood flow towards the liver. Other: None. IMPRESSION: Normal right upper quadrant ultrasound. Electronically Signed   By: Craig Farr M.D.   On: 08/29/2023 08:50    Orders Placed This Encounter  Procedures   CBC with Differential/Platelet    Standing Status:   Future    Expected Date:   03/24/2024    Expiration Date:   09/24/2024   Comprehensive metabolic panel    Standing Status:   Future    Expected Date:   03/24/2024    Expiration Date:   09/24/2024     Future Appointments  Date Time Provider Department Center  03/25/2024 10:30 AM [facility] None  03/25/2024 11:00 AM Camran Keady, Chinita, MD [facility] None     This document was completed utilizing speech recognition software. Grammatical errors, random word insertions, pronoun errors, and incomplete sentences are an occasional consequence of this system due to software limitations, ambient noise, and hardware issues. Any formal questions or concerns about the content, text or information contained within the body of this dictation should be directly addressed to the provider for clarification.

## 2023-09-25 NOTE — Assessment & Plan Note (Addendum)
 Intermittent severe abdominal pain (10/10) radiating upwards, exacerbated by greasy foods. Recurrent over the past five years, worsening over the past month. Differential diagnosis includes gallbladder issues, gastritis.   Ultrasound of the abdomen on 08/29/2023 showed no abnormalities  Recommended following up with PCP for referral to GI for further evaluation and consideration of EGD.  Also patient never had screening colonoscopy.

## 2023-09-25 NOTE — Assessment & Plan Note (Signed)
 Discussed the importance of colorectal cancer screening. Recommended discussing with primary care physician about scheduling a colonoscopy, since he never had one.

## 2023-09-25 NOTE — Assessment & Plan Note (Addendum)
 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  No major bleeding issues. Possible causes include previous alcohol use, dietary changes, and potential medication effects. No evidence of major bone marrow problems. Differential diagnosis includes low B12, folic acid , iron, and liver/spleen issues. Explained that alcohol consumption can decrease platelet count. Current platelet level is not worrisome for spontaneous bleeding.   On his initial consultation with us  on 08/28/2023, labs showed stable platelet count of 137,000.  White count and red count were within normal limits.  Iron studies, PT, PTT, CMP, B12, folate were all unremarkable.  HIV and hepatitis panel were negative.  ANA was also negative.  Repeat labs today showed normal platelet count of 150,000.  White count 5700 with normal differential.  Hemoglobin 14.8, MCV 85.  Unremarkable CBCD today.  He was advised to avoid NSAIDs or aspirin which can contribute to thrombocytopenia.  He has quit alcohol completely and he was congratulated on the effort.   No hematological intervention would be warranted at current platelet counts.  It is possible that he has some very mild ITP which is causing intermittent mild thrombocytopenia.  We will continue monitoring alone.

## 2024-03-13 ENCOUNTER — Telehealth: Payer: Self-pay | Admitting: Oncology

## 2024-03-13 NOTE — Telephone Encounter (Signed)
 I LVM with the interpretor informing Vincent Sawyer of his re-scheduled apointment

## 2024-03-25 ENCOUNTER — Inpatient Hospital Stay: Payer: Self-pay | Admitting: Oncology

## 2024-03-25 ENCOUNTER — Inpatient Hospital Stay: Payer: Self-pay

## 2024-03-31 ENCOUNTER — Other Ambulatory Visit: Payer: Self-pay

## 2024-03-31 DIAGNOSIS — D696 Thrombocytopenia, unspecified: Secondary | ICD-10-CM

## 2024-04-01 ENCOUNTER — Inpatient Hospital Stay: Payer: Self-pay | Admitting: Oncology

## 2024-04-01 ENCOUNTER — Inpatient Hospital Stay: Payer: Self-pay | Attending: Medical
